# Patient Record
Sex: Female | Born: 1970 | Race: Black or African American | Hispanic: No | Marital: Single | Smoking: Former smoker
Health system: Southern US, Community
[De-identification: ages and names within clinical notes are randomized; demographics above are authoritative.]

## PROBLEM LIST (undated history)

## (undated) DIAGNOSIS — D649 Anemia, unspecified: Secondary | ICD-10-CM

## (undated) HISTORY — DX: Anemia, unspecified: D64.9

---

## 2006-01-02 HISTORY — PX: LAPAROSCOPIC GASTRIC BANDING: SHX1100

## 2015-06-01 ENCOUNTER — Telehealth: Payer: Self-pay

## 2015-06-01 ENCOUNTER — Ambulatory Visit: Payer: Self-pay | Admitting: Medical

## 2015-06-01 NOTE — Telephone Encounter (Signed)
A, she no showed first visit so she doesn't get to come back

## 2015-06-01 NOTE — Telephone Encounter (Signed)
This patient no showed for their appointment today.Which of the following is necessary for this patient.   A) No follow-up necessary   B) Follow-up urgent. Locate Patient Immediately.   C) Follow-up necessary. Contact patient and Schedule visit in ____ Days.   D) Follow-up Advised. Contact patient and Schedule visit in ____ Days.  Pt was a new pt coming in for a acute visit but since a new pt can not be rescheduled.

## 2015-06-04 NOTE — Telephone Encounter (Signed)
System noted

## 2015-08-09 ENCOUNTER — Telehealth: Payer: Self-pay | Admitting: Hematology and Oncology

## 2015-08-09 ENCOUNTER — Encounter: Payer: Self-pay | Admitting: Hematology and Oncology

## 2015-08-09 NOTE — Telephone Encounter (Signed)
Appointment scheduled with Bertis RuddyGorsuch on 8/17 at 11:15am. Demographics verified. Letter to the referring and mailed to the patient.

## 2015-08-13 ENCOUNTER — Ambulatory Visit (HOSPITAL_BASED_OUTPATIENT_CLINIC_OR_DEPARTMENT_OTHER): Payer: 59 | Admitting: Hematology and Oncology

## 2015-08-13 ENCOUNTER — Telehealth: Payer: Self-pay | Admitting: Hematology and Oncology

## 2015-08-13 ENCOUNTER — Encounter: Payer: Self-pay | Admitting: Hematology and Oncology

## 2015-08-13 ENCOUNTER — Ambulatory Visit (HOSPITAL_BASED_OUTPATIENT_CLINIC_OR_DEPARTMENT_OTHER): Payer: 59

## 2015-08-13 DIAGNOSIS — Z9884 Bariatric surgery status: Secondary | ICD-10-CM

## 2015-08-13 DIAGNOSIS — D509 Iron deficiency anemia, unspecified: Secondary | ICD-10-CM | POA: Insufficient documentation

## 2015-08-13 LAB — CBC & DIFF AND RETIC
BASO%: 1.5 % (ref 0.0–2.0)
Basophils Absolute: 0.1 10*3/uL (ref 0.0–0.1)
EOS ABS: 0.1 10*3/uL (ref 0.0–0.5)
EOS%: 3 % (ref 0.0–7.0)
HCT: 27.5 % — ABNORMAL LOW (ref 34.8–46.6)
HEMOGLOBIN: 7.7 g/dL — AB (ref 11.6–15.9)
Immature Retic Fract: 6.7 % (ref 1.60–10.00)
LYMPH%: 28.6 % (ref 14.0–49.7)
MCH: 17.7 pg — AB (ref 25.1–34.0)
MCHC: 28.1 g/dL — AB (ref 31.5–36.0)
MCV: 63.1 fL — ABNORMAL LOW (ref 79.5–101.0)
MONO#: 0.5 10*3/uL (ref 0.1–0.9)
MONO%: 13.4 % (ref 0.0–14.0)
NEUT%: 53.5 % (ref 38.4–76.8)
NEUTROS ABS: 2.2 10*3/uL (ref 1.5–6.5)
Platelets: 241 10*3/uL (ref 145–400)
RBC: 4.36 10*6/uL (ref 3.70–5.45)
RDW: 18.3 % — AB (ref 11.2–14.5)
RETIC %: 1.04 % (ref 0.70–2.10)
Retic Ct Abs: 45.34 10*3/uL (ref 33.70–90.70)
WBC: 4.1 10*3/uL (ref 3.9–10.3)
lymph#: 1.2 10*3/uL (ref 0.9–3.3)

## 2015-08-13 LAB — TECHNOLOGIST REVIEW

## 2015-08-13 NOTE — Assessment & Plan Note (Signed)
The most likely cause of her anemia is due to chronic blood loss/malabsorption syndrome. We discussed some of the risks, benefits, and alternatives of intravenous iron infusions. The patient is symptomatic from anemia and the iron level is critically low. She tolerated oral iron supplement poorly and desires to achieved higher levels of iron faster for adequate hematopoesis. Some of the side-effects to be expected including risks of infusion reactions, phlebitis, headaches, nausea and fatigue.  The patient is willing to proceed. Patient education material was dispensed.  Goal is to keep ferritin level greater than 50 I will see her back in 1 months with repeat blood work

## 2015-08-13 NOTE — Assessment & Plan Note (Signed)
The patient is at risk of multiple mineral deficiency including vitamin B-12 and vitamin D deficiency. I will order additional workup and replace as needed

## 2015-08-13 NOTE — Progress Notes (Signed)
[facility] CONSULT NOTE  Patient Care Team: No Pcp Per Patient as PCP - General (General Practice)  CHIEF COMPLAINTS/PURPOSE OF CONSULTATION:  Severe microcytic anemia  HISTORY OF PRESENTING ILLNESS:  Caitlin Sims 45 y.o. female is here because of severe, recurrent microcytic anemia  She was found to have abnormal CBC from recent routine blood work monitoring through her gynecologist office. This patient had laparoscopic gastric banding in 2008. Since then, she have recurrent, severe anemia requiring multiple blood transfusions. Her last blood transfusion was in February 2016. Most recently, her hemoglobin was low at 7.3 with microcytosis. She denies recent chest pain on exertion, shortness of breath on minimal exertion but has pre-syncopal episodes, and palpitations. She has significantly crams She had not noticed any recent bleeding such as epistaxis, hematuria or hematochezia The patient denies over the counter NSAID ingestion. She is not on antiplatelets agents.  She had no prior history or diagnosis of cancer. Her age appropriate screening programs are up-to-date. She has recent pica craving meat but eats a variety of diet. She never donated blood  The patient was prescribed oral iron supplements but it is ineffective and causes a lot of dyspepsia.  MEDICAL HISTORY:  Past Medical History:  Diagnosis Date  . Anemia     SURGICAL HISTORY: Past Surgical History:  Procedure Laterality Date  . CESAREAN SECTION    . LAPAROSCOPIC GASTRIC BANDING  2008    SOCIAL HISTORY: Social History   Social History  . Marital status: Legally Separated    Spouse name: N/A  . Number of children: 2  . Years of education: N/A   Occupational History  . service officer    Social History Main Topics  . Smoking status: Former Smoker    Packs/day: 0.25    Years: 15.00    Types: Cigarettes  . Smokeless tobacco: Current User     Comment: Vapour  . Alcohol use 0.6  oz/week    1 Glasses of wine per week  . Drug use: No  . Sexual activity: Not on file   Other Topics Concern  . Not on file   Social History Narrative  . No narrative on file    FAMILY HISTORY: Family History  Problem Relation Age of Onset  . Cancer Maternal Grandmother     stomach ca    ALLERGIES:  has No Known Allergies.  MEDICATIONS:  No current outpatient prescriptions on file.   No current facility-administered medications for this visit.     REVIEW OF SYSTEMS:   Constitutional: Denies fevers, chills or abnormal night sweats Eyes: Denies blurriness of vision, double vision or watery eyes Ears, nose, mouth, throat, and face: Denies mucositis or sore throat Respiratory: Denies cough, dyspnea or wheezes Cardiovascular: Denies chest discomfort or lower extremity swelling Gastrointestinal:  Denies nausea, heartburn or change in bowel habits Skin: Denies abnormal skin rashes Lymphatics: Denies new lymphadenopathy or easy bruising Neurological:Denies numbness, tingling or new weaknesses Behavioral/Psych: Mood is stable, no new changes  All other systems were reviewed with the patient and are negative.  PHYSICAL EXAMINATION: ECOG PERFORMANCE STATUS: 1 - Symptomatic but completely ambulatory  Vitals:   08/13/15 1412  BP: (!) 123/37  Pulse: 74  Resp: 18  Temp: 98.5 F (36.9 C)   Filed Weights   08/13/15 1412  Weight: 169 lb 11.2 oz (77 kg)    GENERAL:alert, no distress and comfortable SKIN: skin color, texture, turgor are normal, no rashes or significant lesions EYES: normal, conjunctiva are pale  and non-injected, sclera clear OROPHARYNX:no exudate, no erythema and lips, buccal mucosa, and tongue normal  NECK: supple, thyroid normal size, non-tender, without nodularity LYMPH:  no palpable lymphadenopathy in the cervical, axillary or inguinal LUNGS: clear to auscultation and percussion with normal breathing effort HEART: regular rate & rhythm and no murmurs  and no lower extremity edema ABDOMEN:abdomen soft, non-tender and normal bowel sounds. Palpable lump in the epigastric region consistent with her gastric LAP-BAND Musculoskeletal:no cyanosis of digits and no clubbing  PSYCH: alert & oriented x 3 with fluent speech NEURO: no focal motor/sensory deficits  ASSESSMENT & PLAN:  Iron deficiency anemia The most likely cause of her anemia is due to chronic blood loss/malabsorption syndrome. We discussed some of the risks, benefits, and alternatives of intravenous iron infusions. The patient is symptomatic from anemia and the iron level is critically low. She tolerated oral iron supplement poorly and desires to achieved higher levels of iron faster for adequate hematopoesis. Some of the side-effects to be expected including risks of infusion reactions, phlebitis, headaches, nausea and fatigue.  The patient is willing to proceed. Patient education material was dispensed.  Goal is to keep ferritin level greater than 50 I will see her back in 1 months with repeat blood work   S/P bariatric surgery The patient is at risk of multiple mineral deficiency including vitamin B-12 and vitamin D deficiency. I will order additional workup and replace as needed  Orders Placed This Encounter  Procedures  . Ferritin    Standing Status:   Standing    Number of Occurrences:   9    Standing Expiration Date:   08/12/2016  . CBC & Diff and Retic    Standing Status:   Standing    Number of Occurrences:   9    Standing Expiration Date:   08/12/2016  . Vitamin B12    Standing Status:   Future    Number of Occurrences:   1    Standing Expiration Date:   09/16/2016  . Iron and TIBC    Standing Status:   Future    Number of Occurrences:   1    Standing Expiration Date:   09/16/2016  . Vitamin D 25 hydroxy    Standing Status:   Future    Number of Occurrences:   1    Standing Expiration Date:   08/12/2016     All questions were answered. The patient knows to call  the clinic with any problems, questions or concerns. I spent 40 minutes counseling the patient face to face. The total time spent in the appointment was 55 minutes and more than 50% was on counseling.     [facility], Vaniah Chambers, MD 08/13/15 4:08 PM

## 2015-08-13 NOTE — Telephone Encounter (Signed)
Gave patient avs report and appointments for August and September  °

## 2015-08-14 LAB — VITAMIN D 25 HYDROXY (VIT D DEFICIENCY, FRACTURES): VIT D 25 HYDROXY: 37 ng/mL (ref 30.0–100.0)

## 2015-08-14 LAB — VITAMIN B12: VITAMIN B 12: 739 pg/mL (ref 211–946)

## 2015-08-16 ENCOUNTER — Telehealth: Payer: Self-pay | Admitting: *Deleted

## 2015-08-16 LAB — IRON AND TIBC: TIBC: 365 ug/dL (ref 236–444)

## 2015-08-16 LAB — FERRITIN: Ferritin: 5 ng/ml — ABNORMAL LOW (ref 9–269)

## 2015-08-16 NOTE — Telephone Encounter (Signed)
LM to call Dr Gorsuch' nurse 

## 2015-08-16 NOTE — Telephone Encounter (Signed)
Pt notified of message below. Verbalized understanding 

## 2015-08-16 NOTE — Telephone Encounter (Signed)
-----   Message from Artis DelayNi Gorsuch, MD sent at 08/16/2015 10:30 AM EDT ----- Regarding: labs Pls remind her of IV iron appt Labs came back for Vitamin D and B12; both levels are OK for now so I will just be giving IV iron ----- Message ----- From: Interface, Lab In Three Zero One Sent: 08/13/2015   3:33 PM To: Artis DelayNi Gorsuch, MD

## 2015-08-19 ENCOUNTER — Ambulatory Visit: Payer: Self-pay | Admitting: Hematology and Oncology

## 2015-08-20 ENCOUNTER — Ambulatory Visit (HOSPITAL_BASED_OUTPATIENT_CLINIC_OR_DEPARTMENT_OTHER): Payer: 59

## 2015-08-20 ENCOUNTER — Ambulatory Visit: Payer: 59 | Admitting: Hematology and Oncology

## 2015-08-20 VITALS — BP 100/45 | HR 75 | Temp 98.1°F | Resp 18

## 2015-08-20 DIAGNOSIS — Z9884 Bariatric surgery status: Secondary | ICD-10-CM

## 2015-08-20 DIAGNOSIS — D509 Iron deficiency anemia, unspecified: Secondary | ICD-10-CM | POA: Diagnosis not present

## 2015-08-20 MED ORDER — SODIUM CHLORIDE 0.9 % IV SOLN
510.0000 mg | Freq: Once | INTRAVENOUS | Status: AC
  Administered 2015-08-20: 510 mg via INTRAVENOUS
  Filled 2015-08-20: qty 17

## 2015-08-20 MED ORDER — SODIUM CHLORIDE 0.9 % IV SOLN
Freq: Once | INTRAVENOUS | Status: AC
  Administered 2015-08-20: 10:00:00 via INTRAVENOUS

## 2015-08-20 NOTE — Progress Notes (Signed)
First-time feraheme. Pt aware of appt new week to infuse again. 8/11 ferritin 5 and iron <11. Pt educated on potential tx symptoms. Pt verbalized understanding about calling with any concerns or visiting ED with serious symptoms. VS stable upon discharge, pt asymptomatic.

## 2015-08-20 NOTE — Patient Instructions (Signed)

## 2015-08-27 ENCOUNTER — Ambulatory Visit (HOSPITAL_BASED_OUTPATIENT_CLINIC_OR_DEPARTMENT_OTHER): Payer: 59

## 2015-08-27 VITALS — BP 123/69 | HR 66 | Temp 98.7°F | Resp 16

## 2015-08-27 DIAGNOSIS — D509 Iron deficiency anemia, unspecified: Secondary | ICD-10-CM | POA: Diagnosis not present

## 2015-08-27 DIAGNOSIS — Z9884 Bariatric surgery status: Secondary | ICD-10-CM

## 2015-08-27 MED ORDER — SODIUM CHLORIDE 0.9 % IV SOLN
Freq: Once | INTRAVENOUS | Status: AC
  Administered 2015-08-27: 11:00:00 via INTRAVENOUS

## 2015-08-27 MED ORDER — SODIUM CHLORIDE 0.9 % IV SOLN
510.0000 mg | Freq: Once | INTRAVENOUS | Status: AC
  Administered 2015-08-27: 510 mg via INTRAVENOUS
  Filled 2015-08-27: qty 17

## 2015-08-27 NOTE — Patient Instructions (Signed)

## 2015-09-30 ENCOUNTER — Ambulatory Visit (HOSPITAL_BASED_OUTPATIENT_CLINIC_OR_DEPARTMENT_OTHER): Payer: 59

## 2015-09-30 ENCOUNTER — Telehealth: Payer: Self-pay | Admitting: Hematology and Oncology

## 2015-09-30 ENCOUNTER — Other Ambulatory Visit (HOSPITAL_BASED_OUTPATIENT_CLINIC_OR_DEPARTMENT_OTHER): Payer: 59

## 2015-09-30 ENCOUNTER — Ambulatory Visit (HOSPITAL_BASED_OUTPATIENT_CLINIC_OR_DEPARTMENT_OTHER): Payer: 59 | Admitting: Hematology and Oncology

## 2015-09-30 ENCOUNTER — Encounter: Payer: Self-pay | Admitting: Hematology and Oncology

## 2015-09-30 VITALS — BP 118/67 | HR 59 | Temp 98.3°F | Resp 18 | Ht 65.5 in | Wt 172.5 lb

## 2015-09-30 VITALS — BP 108/71 | HR 68 | Temp 97.9°F | Resp 18

## 2015-09-30 DIAGNOSIS — Z9884 Bariatric surgery status: Secondary | ICD-10-CM

## 2015-09-30 DIAGNOSIS — D509 Iron deficiency anemia, unspecified: Secondary | ICD-10-CM | POA: Diagnosis not present

## 2015-09-30 DIAGNOSIS — D72819 Decreased white blood cell count, unspecified: Secondary | ICD-10-CM | POA: Diagnosis not present

## 2015-09-30 LAB — CBC & DIFF AND RETIC
BASO%: 0.5 % (ref 0.0–2.0)
BASOS ABS: 0 10*3/uL (ref 0.0–0.1)
EOS%: 7.2 % — ABNORMAL HIGH (ref 0.0–7.0)
Eosinophils Absolute: 0.3 10*3/uL (ref 0.0–0.5)
HEMATOCRIT: 36.9 % (ref 34.8–46.6)
HEMOGLOBIN: 11.3 g/dL — AB (ref 11.6–15.9)
Immature Retic Fract: 1.8 % (ref 1.60–10.00)
LYMPH#: 1.2 10*3/uL (ref 0.9–3.3)
LYMPH%: 31 % (ref 14.0–49.7)
MCH: 24.4 pg — AB (ref 25.1–34.0)
MCHC: 30.6 g/dL — AB (ref 31.5–36.0)
MCV: 79.5 fL (ref 79.5–101.0)
MONO#: 0.4 10*3/uL (ref 0.1–0.9)
MONO%: 9.5 % (ref 0.0–14.0)
NEUT%: 51.8 % (ref 38.4–76.8)
NEUTROS ABS: 2 10*3/uL (ref 1.5–6.5)
Platelets: 185 10*3/uL (ref 145–400)
RBC: 4.64 10*6/uL (ref 3.70–5.45)
RETIC %: 0.42 % — AB (ref 0.70–2.10)
RETIC CT ABS: 19.49 10*3/uL — AB (ref 33.70–90.70)
WBC: 3.8 10*3/uL — AB (ref 3.9–10.3)
nRBC: 0 % (ref 0–0)

## 2015-09-30 LAB — FERRITIN: Ferritin: 31 ng/ml (ref 9–269)

## 2015-09-30 MED ORDER — SODIUM CHLORIDE 0.9 % IV SOLN
510.0000 mg | Freq: Once | INTRAVENOUS | Status: AC
  Administered 2015-09-30: 510 mg via INTRAVENOUS
  Filled 2015-09-30: qty 17

## 2015-09-30 MED ORDER — SODIUM CHLORIDE 0.9 % IV SOLN
Freq: Once | INTRAVENOUS | Status: AC
  Administered 2015-09-30: 12:00:00 via INTRAVENOUS

## 2015-09-30 NOTE — Assessment & Plan Note (Signed)
Cause is unknown. She is not symptomatic. Recommend observation only.

## 2015-09-30 NOTE — Telephone Encounter (Signed)
APPOINTMENTS COMPLETE PER 9/28 LOS - LAB ONY 01/06/16. PATIENT TO GET PRINT OUT IN INFUSION AREA.

## 2015-09-30 NOTE — Assessment & Plan Note (Signed)
The most likely cause of her anemia is due to chronic blood loss/malabsorption syndrome. We discussed some of the risks, benefits, and alternatives of intravenous iron infusions. The patient is symptomatic from anemia and the iron level is critically low. She tolerated oral iron supplement poorly and desires to achieved higher levels of iron faster for adequate hematopoesis. Some of the side-effects to be expected including risks of infusion reactions, phlebitis, headaches, nausea and fatigue.  The patient is willing to proceed. Patient education material was dispensed.  Goal is to keep ferritin level greater than 50 She does not have a primary care doctor. I will bring her back in 4 months with repeat blood work. I will proceed with intravenous iron treatment today

## 2015-09-30 NOTE — Patient Instructions (Signed)

## 2015-09-30 NOTE — Progress Notes (Signed)
[facility] OFFICE PROGRESS NOTE  No PCP Per Patient SUMMARY OF HEMATOLOGIC HISTORY:  Caitlin Sims 45 y.o. female is here because of severe, recurrent microcytic anemia  She was found to have abnormal CBC from recent routine blood work monitoring through her gynecologist office. This patient had laparoscopic gastric banding in 2008. Since then, she have recurrent, severe anemia requiring multiple blood transfusions. Her last blood transfusion was in February 2016. Most recently, her hemoglobin was low at 7.3 with microcytosis. She denies recent chest pain on exertion, shortness of breath on minimal exertion but has pre-syncopal episodes, and palpitations. She has significantly crams She had not noticed any recent bleeding such as epistaxis, hematuria or hematochezia The patient denies over the counter NSAID ingestion. She is not on antiplatelets agents.  She had no prior history or diagnosis of cancer. Her age appropriate screening programs are up-to-date. She has recent pica craving meat but eats a variety of diet. She never donated blood  The patient was prescribed oral iron supplements but it is ineffective and causes a lot of dyspepsia. She was prescribed intravenous iron in August 2017 with great improvement of anemia INTERVAL HISTORY: Caitlin Sims 45 y.o. female returns for further follow-up. She feels well. She denies infusion reaction. She denies recent pica. The patient denies any recent signs or symptoms of bleeding such as spontaneous epistaxis, hematuria or hematochezia.   I have reviewed the past medical history, past surgical history, social history and family history with the patient and they are unchanged from previous note.  ALLERGIES:  has No Known Allergies.  MEDICATIONS:  No current outpatient prescriptions on file.   No current facility-administered medications for this visit.      REVIEW OF SYSTEMS:   Constitutional: Denies fevers,  chills or night sweats Eyes: Denies blurriness of vision Ears, nose, mouth, throat, and face: Denies mucositis or sore throat Respiratory: Denies cough, dyspnea or wheezes Cardiovascular: Denies palpitation, chest discomfort or lower extremity swelling Gastrointestinal:  Denies nausea, heartburn or change in bowel habits Skin: Denies abnormal skin rashes Lymphatics: Denies new lymphadenopathy or easy bruising Neurological:Denies numbness, tingling or new weaknesses Behavioral/Psych: Mood is stable, no new changes  All other systems were reviewed with the patient and are negative.  PHYSICAL EXAMINATION: ECOG PERFORMANCE STATUS: 0 - Asymptomatic  Vitals:   09/30/15 1104  BP: 118/67  Pulse: (!) 59  Resp: 18  Temp: 98.3 F (36.8 C)   Filed Weights   09/30/15 1104  Weight: 172 lb 8 oz (78.2 kg)    GENERAL:alert, no distress and comfortable SKIN: skin color, texture, turgor are normal, no rashes or significant lesions EYES: normal, Conjunctiva are pink and non-injected, sclera clear OROPHARYNX:no exudate, no erythema and lips, buccal mucosa, and tongue normal  NECK: supple, thyroid normal size, non-tender, without nodularity LYMPH:  no palpable lymphadenopathy in the cervical, axillary or inguinal LUNGS: clear to auscultation and percussion with normal breathing effort HEART: regular rate & rhythm and no murmurs and no lower extremity edema ABDOMEN:abdomen soft, non-tender and normal bowel sounds Musculoskeletal:no cyanosis of digits and no clubbing  NEURO: alert & oriented x 3 with fluent speech, no focal motor/sensory deficits  LABORATORY DATA:  I have reviewed the data as listed  No results found for: NA, K, CL, CO2, GLUCOSE, BUN, CREATININE, CALCIUM, PROT, ALBUMIN, AST, ALT, ALKPHOS, BILITOT, GFRNONAA, GFRAA  No results found for: SPEP, UPEP  Lab Results  Component Value Date   WBC 3.8 (L) 09/30/2015   NEUTROABS 2.0  09/30/2015   HGB 11.3 (L) 09/30/2015   HCT 36.9  09/30/2015   MCV 79.5 09/30/2015   PLT 185 09/30/2015      Chemistry   No results found for: NA, K, CL, CO2, BUN, CREATININE, GLU No results found for: CALCIUM, ALKPHOS, AST, ALT, BILITOT     ASSESSMENT & PLAN:  Iron deficiency anemia The most likely cause of her anemia is due to chronic blood loss/malabsorption syndrome. We discussed some of the risks, benefits, and alternatives of intravenous iron infusions. The patient is symptomatic from anemia and the iron level is critically low. She tolerated oral iron supplement poorly and desires to achieved higher levels of iron faster for adequate hematopoesis. Some of the side-effects to be expected including risks of infusion reactions, phlebitis, headaches, nausea and fatigue.  The patient is willing to proceed. Patient education material was dispensed.  Goal is to keep ferritin level greater than 50 She does not have a primary care doctor. I will bring her back in 4 months with repeat blood work. I will proceed with intravenous iron treatment today   S/P bariatric surgery The patient is at risk of multiple mineral deficiency including vitamin B-12 and vitamin D deficiency. Recent workup showed adequate replacement.  Leukopenia Cause is unknown. She is not symptomatic. Recommend observation only.   All questions were answered. The patient knows to call the clinic with any problems, questions or concerns. No barriers to learning was detected.  I spent 15 minutes counseling the patient face to face. The total time spent in the appointment was 20 minutes and more than 50% was on counseling.     Artis DelayNi Brewster Wolters, MD 9/28/20173:56 PM

## 2015-09-30 NOTE — Assessment & Plan Note (Signed)
The patient is at risk of multiple mineral deficiency including vitamin B-12 and vitamin D deficiency. Recent workup showed adequate replacement.

## 2016-01-06 ENCOUNTER — Telehealth: Payer: Self-pay | Admitting: *Deleted

## 2016-01-06 ENCOUNTER — Other Ambulatory Visit (HOSPITAL_BASED_OUTPATIENT_CLINIC_OR_DEPARTMENT_OTHER): Payer: 59

## 2016-01-06 ENCOUNTER — Other Ambulatory Visit: Payer: Self-pay | Admitting: Hematology and Oncology

## 2016-01-06 DIAGNOSIS — D509 Iron deficiency anemia, unspecified: Secondary | ICD-10-CM | POA: Diagnosis not present

## 2016-01-06 DIAGNOSIS — Z9884 Bariatric surgery status: Secondary | ICD-10-CM

## 2016-01-06 LAB — CBC & DIFF AND RETIC
BASO%: 0.9 % (ref 0.0–2.0)
BASOS ABS: 0 10*3/uL (ref 0.0–0.1)
EOS%: 7.3 % — AB (ref 0.0–7.0)
Eosinophils Absolute: 0.2 10*3/uL (ref 0.0–0.5)
HEMATOCRIT: 37.8 % (ref 34.8–46.6)
HGB: 11.9 g/dL (ref 11.6–15.9)
Immature Retic Fract: 3.4 % (ref 1.60–10.00)
LYMPH%: 36.1 % (ref 14.0–49.7)
MCH: 28.3 pg (ref 25.1–34.0)
MCHC: 31.5 g/dL (ref 31.5–36.0)
MCV: 89.8 fL (ref 79.5–101.0)
MONO#: 0.4 10*3/uL (ref 0.1–0.9)
MONO%: 11.3 % (ref 0.0–14.0)
NEUT#: 1.5 10*3/uL (ref 1.5–6.5)
NEUT%: 44.4 % (ref 38.4–76.8)
PLATELETS: 166 10*3/uL (ref 145–400)
RBC: 4.21 10*6/uL (ref 3.70–5.45)
RDW: 13.9 % (ref 11.2–14.5)
Retic %: 1.66 % (ref 0.70–2.10)
Retic Ct Abs: 69.89 10*3/uL (ref 33.70–90.70)
WBC: 3.3 10*3/uL — ABNORMAL LOW (ref 3.9–10.3)
lymph#: 1.2 10*3/uL (ref 0.9–3.3)

## 2016-01-06 LAB — FERRITIN: Ferritin: 14 ng/ml (ref 9–269)

## 2016-01-06 NOTE — Telephone Encounter (Signed)
Pt notified of message below.  States any day works for her.  Msg to scheduler for labs in April and see Dr Bertis RuddyGorsuch the following week with possible iron infusion

## 2016-01-06 NOTE — Telephone Encounter (Signed)
-----   Message from Artis DelayNi Gorsuch, MD sent at 01/06/2016 10:31 AM EST ----- Regarding: labs PLs let her know CBC OK, not anemic Iron level a bit low I suspect she may need IV iron again in 3 months I recommend she returns for labs in April. Please ask what day works and place scheduling msg for labs 1 week before she sees me with same day IV feraheme scheduled ----- Message ----- From: Interface, Lab In Three Zero One Sent: 01/06/2016   8:31 AM To: Artis DelayNi Gorsuch, MD

## 2016-04-06 ENCOUNTER — Other Ambulatory Visit: Payer: 59

## 2016-04-13 ENCOUNTER — Ambulatory Visit: Payer: 59 | Admitting: Hematology and Oncology

## 2016-04-13 ENCOUNTER — Ambulatory Visit: Payer: 59

## 2016-05-10 ENCOUNTER — Telehealth: Payer: Self-pay | Admitting: Hematology and Oncology

## 2016-05-10 NOTE — Telephone Encounter (Signed)
R/s per patient. Patient came in to reschedule a missed appt from 4/5 and 4/12. Labs and f/u scheduled - messaged Dr. Bertis RuddyGorsuch to see if infusion still needed.

## 2016-05-30 ENCOUNTER — Other Ambulatory Visit (HOSPITAL_BASED_OUTPATIENT_CLINIC_OR_DEPARTMENT_OTHER): Payer: 59

## 2016-05-30 ENCOUNTER — Ambulatory Visit (HOSPITAL_BASED_OUTPATIENT_CLINIC_OR_DEPARTMENT_OTHER): Payer: 59 | Admitting: Hematology and Oncology

## 2016-05-30 ENCOUNTER — Encounter: Payer: Self-pay | Admitting: Hematology and Oncology

## 2016-05-30 ENCOUNTER — Telehealth: Payer: Self-pay | Admitting: Hematology and Oncology

## 2016-05-30 VITALS — BP 130/84 | HR 63 | Temp 98.7°F | Resp 18 | Ht 65.5 in | Wt 197.0 lb

## 2016-05-30 DIAGNOSIS — D72819 Decreased white blood cell count, unspecified: Secondary | ICD-10-CM

## 2016-05-30 DIAGNOSIS — Z9884 Bariatric surgery status: Secondary | ICD-10-CM

## 2016-05-30 DIAGNOSIS — D509 Iron deficiency anemia, unspecified: Secondary | ICD-10-CM

## 2016-05-30 DIAGNOSIS — R5383 Other fatigue: Secondary | ICD-10-CM | POA: Insufficient documentation

## 2016-05-30 LAB — CBC & DIFF AND RETIC
BASO%: 0.6 % (ref 0.0–2.0)
Basophils Absolute: 0 10*3/uL (ref 0.0–0.1)
EOS%: 4.8 % (ref 0.0–7.0)
Eosinophils Absolute: 0.2 10*3/uL (ref 0.0–0.5)
HCT: 36.8 % (ref 34.8–46.6)
HGB: 11.5 g/dL — ABNORMAL LOW (ref 11.6–15.9)
Immature Retic Fract: 7.4 % (ref 1.60–10.00)
LYMPH%: 38.6 % (ref 14.0–49.7)
MCH: 27.6 pg (ref 25.1–34.0)
MCHC: 31.3 g/dL — AB (ref 31.5–36.0)
MCV: 88.2 fL (ref 79.5–101.0)
MONO#: 0.5 10*3/uL (ref 0.1–0.9)
MONO%: 14.4 % — AB (ref 0.0–14.0)
NEUT%: 41.6 % (ref 38.4–76.8)
NEUTROS ABS: 1.4 10*3/uL — AB (ref 1.5–6.5)
Platelets: 192 10*3/uL (ref 145–400)
RBC: 4.17 10*6/uL (ref 3.70–5.45)
RDW: 13.7 % (ref 11.2–14.5)
RETIC %: 1.7 % (ref 0.70–2.10)
Retic Ct Abs: 70.89 10*3/uL (ref 33.70–90.70)
WBC: 3.3 10*3/uL — AB (ref 3.9–10.3)
lymph#: 1.3 10*3/uL (ref 0.9–3.3)

## 2016-05-30 LAB — FERRITIN: FERRITIN: 11 ng/mL (ref 9–269)

## 2016-05-30 NOTE — Assessment & Plan Note (Signed)
The most likely cause of her anemia is due to chronic blood loss/malabsorption syndrome. We discussed some of the risks, benefits, and alternatives of intravenous iron infusions. The patient is symptomatic from anemia and the iron level is critically low. She tolerated oral iron supplement poorly and desires to achieved higher levels of iron faster for adequate hematopoesis. Some of the side-effects to be expected including risks of infusion reactions, phlebitis, headaches, nausea and fatigue.  The patient is willing to proceed. Patient education material was dispensed.  Goal is to keep ferritin level greater than 50 I recommend IV iron weekly 2 and then see her back in 8 months

## 2016-05-30 NOTE — Assessment & Plan Note (Signed)
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 Just to be sure, I will check copper and serum vitamin B12

## 2016-05-30 NOTE — Assessment & Plan Note (Signed)
The patient is at risk of multiple mineral deficiencies I will check serum vitamin B12 and copper level next month

## 2016-05-30 NOTE — Telephone Encounter (Signed)
Scheduled appts per 5/29 LOS -

## 2016-05-30 NOTE — Progress Notes (Signed)
[facility] OFFICE PROGRESS NOTE  Patient, No Pcp Per SUMMARY OF HEMATOLOGIC HISTORY:  Caitlin Sims is here because of severe, recurrent microcytic anemia  She was found to have abnormal CBC from recent routine blood work monitoring through her gynecologist office. This patient had laparoscopic gastric banding in 2008. Since then, she have recurrent, severe anemia requiring multiple blood transfusions. Her last blood transfusion was in February 2016. Most recently, her hemoglobin was low at 7.3 with microcytosis. She denies recent chest pain on exertion, shortness of breath on minimal exertion but has pre-syncopal episodes, and palpitations. She has significantly crams She had not noticed any recent bleeding such as epistaxis, hematuria or hematochezia The patient denies over the counter NSAID ingestion. She is not on antiplatelets agents.  She had no prior history or diagnosis of cancer. Her age appropriate screening programs are up-to-date. She has recent pica craving meat but eats a variety of diet. She never donated blood  The patient was prescribed oral iron supplements but it is ineffective and causes a lot of dyspepsia. She was prescribed intravenous iron in August 2017 with great improvement of anemia INTERVAL HISTORY: Caitlin Sims 46 y.o. female returns for further follow-up. She complain of excessive fatigue She had recent irregular periods.  She has appointment pending to see her GYN doctor She denies recent infection The patient denies any recent signs or symptoms of bleeding such as spontaneous epistaxis, hematuria or hematochezia.  I have reviewed the past medical history, past surgical history, social history and family history with the patient and they are unchanged from previous note.  ALLERGIES:  has No Known Allergies.  MEDICATIONS:  No current outpatient prescriptions on file.   No current facility-administered medications for this visit.       REVIEW OF SYSTEMS:   Constitutional: Denies fevers, chills or night sweats Eyes: Denies blurriness of vision Ears, nose, mouth, throat, and face: Denies mucositis or sore throat Respiratory: Denies cough, dyspnea or wheezes Cardiovascular: Denies palpitation, chest discomfort or lower extremity swelling Gastrointestinal:  Denies nausea, heartburn or change in bowel habits Skin: Denies abnormal skin rashes Lymphatics: Denies new lymphadenopathy or easy bruising Neurological:Denies numbness, tingling or new weaknesses Behavioral/Psych: Mood is stable, no new changes  All other systems were reviewed with the patient and are negative.  PHYSICAL EXAMINATION: ECOG PERFORMANCE STATUS: 1 - Symptomatic but completely ambulatory  Vitals:   05/30/16 0902  BP: 130/84  Pulse: 63  Resp: 18  Temp: 98.7 F (37.1 C)   Filed Weights   05/30/16 0902  Weight: 197 lb (89.4 kg)    GENERAL:alert, no distress and comfortable SKIN: skin color, texture, turgor are normal, no rashes or significant lesions EYES: normal, Conjunctiva are pink and non-injected, sclera clear Musculoskeletal:no cyanosis of digits and no clubbing  NEURO: alert & oriented x 3 with fluent speech, no focal motor/sensory deficits  LABORATORY DATA:  I have reviewed the data as listed  No results found for: NA, K, CL, CO2, GLUCOSE, BUN, CREATININE, CALCIUM, PROT, ALBUMIN, AST, ALT, ALKPHOS, BILITOT, GFRNONAA, GFRAA  No results found for: SPEP, UPEP  Lab Results  Component Value Date   WBC 3.3 (L) 05/30/2016   NEUTROABS 1.4 (L) 05/30/2016   HGB 11.5 (L) 05/30/2016   HCT 36.8 05/30/2016   MCV 88.2 05/30/2016   PLT 192 05/30/2016      Chemistry   No results found for: NA, K, CL, CO2, BUN, CREATININE, GLU No results found for: CALCIUM, ALKPHOS, AST, ALT, BILITOT  ASSESSMENT & PLAN:  Iron deficiency anemia The most likely cause of her anemia is due to chronic blood loss/malabsorption syndrome. We discussed  some of the risks, benefits, and alternatives of intravenous iron infusions. The patient is symptomatic from anemia and the iron level is critically low. She tolerated oral iron supplement poorly and desires to achieved higher levels of iron faster for adequate hematopoesis. Some of the side-effects to be expected including risks of infusion reactions, phlebitis, headaches, nausea and fatigue.  The patient is willing to proceed. Patient education material was dispensed.  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 Just to be sure, I will check copper and serum vitamin B12  S/P bariatric surgery The patient is at risk of multiple mineral deficiencies I will check serum vitamin B12 and copper level next month  Other fatigue She has excessive fatigue and recent weight gain It is likely that her bariatric surgery is not successful I will check serum TSH just to be sure that she does not have hypothyroidism   Orders Placed This Encounter  Procedures  . Vitamin B12    Standing Status:   Future    Standing Expiration Date:   07/04/2017  . Copper, serum    Standing Status:   Future    Standing Expiration Date:   05/30/2017  . Vitamin D 25 hydroxy    Standing Status:   Future    Standing Expiration Date:   05/30/2017  . TSH    Standing Status:   Future    Standing Expiration Date:   07/04/2017  . CBC with Differential/Platelet    Standing Status:   Future    Standing Expiration Date:   07/04/2017    All questions were answered. The patient knows to call the clinic with any problems, questions or concerns. No barriers to learning was detected.  I spent 15 minutes counseling the patient face to face. The total time spent in the appointment was 20 minutes and more than 50% was on counseling.     Artis Delay, MD 5/29/201812:00  PM

## 2016-05-30 NOTE — Assessment & Plan Note (Signed)
She has excessive fatigue and recent weight gain It is likely that her bariatric surgery is not successful I will check serum TSH just to be sure that she does not have hypothyroidism

## 2016-06-06 ENCOUNTER — Ambulatory Visit (HOSPITAL_BASED_OUTPATIENT_CLINIC_OR_DEPARTMENT_OTHER): Payer: 59

## 2016-06-06 VITALS — BP 131/69 | HR 70 | Temp 97.9°F | Resp 18

## 2016-06-06 DIAGNOSIS — D509 Iron deficiency anemia, unspecified: Secondary | ICD-10-CM

## 2016-06-06 DIAGNOSIS — Z9884 Bariatric surgery status: Secondary | ICD-10-CM

## 2016-06-06 MED ORDER — SODIUM CHLORIDE 0.9 % IV SOLN
510.0000 mg | Freq: Once | INTRAVENOUS | Status: AC
  Administered 2016-06-06: 510 mg via INTRAVENOUS
  Filled 2016-06-06: qty 17

## 2016-06-06 MED ORDER — SODIUM CHLORIDE 0.9 % IV SOLN
Freq: Once | INTRAVENOUS | Status: AC
  Administered 2016-06-06: 09:00:00 via INTRAVENOUS

## 2016-06-06 NOTE — Patient Instructions (Signed)

## 2016-06-13 ENCOUNTER — Other Ambulatory Visit: Payer: Self-pay | Admitting: Hematology and Oncology

## 2016-06-13 ENCOUNTER — Ambulatory Visit (HOSPITAL_BASED_OUTPATIENT_CLINIC_OR_DEPARTMENT_OTHER): Payer: 59

## 2016-06-13 ENCOUNTER — Other Ambulatory Visit (HOSPITAL_BASED_OUTPATIENT_CLINIC_OR_DEPARTMENT_OTHER): Payer: 59

## 2016-06-13 VITALS — BP 117/98 | HR 68 | Temp 98.5°F | Resp 18

## 2016-06-13 DIAGNOSIS — D509 Iron deficiency anemia, unspecified: Secondary | ICD-10-CM | POA: Diagnosis not present

## 2016-06-13 DIAGNOSIS — Z9884 Bariatric surgery status: Secondary | ICD-10-CM

## 2016-06-13 DIAGNOSIS — R5383 Other fatigue: Secondary | ICD-10-CM

## 2016-06-13 DIAGNOSIS — D72819 Decreased white blood cell count, unspecified: Secondary | ICD-10-CM | POA: Diagnosis not present

## 2016-06-13 LAB — TSH: TSH: 1.001 m(IU)/L (ref 0.308–3.960)

## 2016-06-13 LAB — CBC WITH DIFFERENTIAL/PLATELET
BASO%: 0.6 % (ref 0.0–2.0)
BASOS ABS: 0 10*3/uL (ref 0.0–0.1)
EOS%: 3.6 % (ref 0.0–7.0)
Eosinophils Absolute: 0.1 10*3/uL (ref 0.0–0.5)
HCT: 36.6 % (ref 34.8–46.6)
HEMOGLOBIN: 11.4 g/dL — AB (ref 11.6–15.9)
LYMPH%: 31 % (ref 14.0–49.7)
MCH: 27.4 pg (ref 25.1–34.0)
MCHC: 31.1 g/dL — AB (ref 31.5–36.0)
MCV: 88 fL (ref 79.5–101.0)
MONO#: 0.4 10*3/uL (ref 0.1–0.9)
MONO%: 10.9 % (ref 0.0–14.0)
NEUT#: 1.9 10*3/uL (ref 1.5–6.5)
NEUT%: 53.9 % (ref 38.4–76.8)
PLATELETS: 170 10*3/uL (ref 145–400)
RBC: 4.16 10*6/uL (ref 3.70–5.45)
RDW: 14.2 % (ref 11.2–14.5)
WBC: 3.6 10*3/uL — ABNORMAL LOW (ref 3.9–10.3)
lymph#: 1.1 10*3/uL (ref 0.9–3.3)

## 2016-06-13 MED ORDER — SODIUM CHLORIDE 0.9 % IV SOLN
510.0000 mg | Freq: Once | INTRAVENOUS | Status: AC
  Administered 2016-06-13: 510 mg via INTRAVENOUS
  Filled 2016-06-13: qty 17

## 2016-06-13 NOTE — Progress Notes (Signed)
Pt inquired about B12 shot, Per Dr Bertis RuddyGorsuch will call pt with results, if B12 abnormal will tx.

## 2016-06-13 NOTE — Patient Instructions (Signed)

## 2016-06-14 LAB — VITAMIN D 25 HYDROXY (VIT D DEFICIENCY, FRACTURES): Vitamin D, 25-Hydroxy: 26.1 ng/mL — ABNORMAL LOW (ref 30.0–100.0)

## 2016-06-14 LAB — VITAMIN B12: VITAMIN B 12: 587 pg/mL (ref 232–1245)

## 2016-06-15 ENCOUNTER — Telehealth: Payer: Self-pay | Admitting: *Deleted

## 2016-06-15 LAB — COPPER, SERUM: Copper: 112 ug/dL (ref 72–166)

## 2016-06-15 NOTE — Telephone Encounter (Signed)
Notified of message below. Verbalized understanding 

## 2016-06-15 NOTE — Telephone Encounter (Signed)
-----   Message from Artis DelayNi Gorsuch, MD sent at 06/15/2016  7:06 AM EDT ----- Regarding: blood test results Please let her know B12 level is OK Vitamin D level is low I recommend over the counter dose of 2000 units per day (no prescription is needed)  She needs close follow-up for repeat labs in 6 months with PCP ----- Message ----- From: Interface, Lab In Three Zero One Sent: 06/13/2016   9:06 AM To: Artis DelayNi Gorsuch, MD

## 2016-06-25 ENCOUNTER — Encounter (HOSPITAL_BASED_OUTPATIENT_CLINIC_OR_DEPARTMENT_OTHER): Payer: Self-pay | Admitting: Emergency Medicine

## 2016-06-25 ENCOUNTER — Emergency Department (HOSPITAL_BASED_OUTPATIENT_CLINIC_OR_DEPARTMENT_OTHER): Payer: Worker's Compensation

## 2016-06-25 ENCOUNTER — Emergency Department (HOSPITAL_BASED_OUTPATIENT_CLINIC_OR_DEPARTMENT_OTHER)
Admission: EM | Admit: 2016-06-25 | Discharge: 2016-06-25 | Disposition: A | Discharge status: Home / Self Care | Payer: Worker's Compensation | Attending: Emergency Medicine | Admitting: Emergency Medicine

## 2016-06-25 DIAGNOSIS — S6992XA Unspecified injury of left wrist, hand and finger(s), initial encounter: Secondary | ICD-10-CM

## 2016-06-25 DIAGNOSIS — Z87891 Personal history of nicotine dependence: Secondary | ICD-10-CM | POA: Diagnosis not present

## 2016-06-25 DIAGNOSIS — S60222A Contusion of left hand, initial encounter: Secondary | ICD-10-CM | POA: Diagnosis not present

## 2016-06-25 DIAGNOSIS — Y929 Unspecified place or not applicable: Secondary | ICD-10-CM | POA: Diagnosis not present

## 2016-06-25 DIAGNOSIS — W230XXA Caught, crushed, jammed, or pinched between moving objects, initial encounter: Secondary | ICD-10-CM | POA: Insufficient documentation

## 2016-06-25 DIAGNOSIS — Y9389 Activity, other specified: Secondary | ICD-10-CM | POA: Diagnosis not present

## 2016-06-25 DIAGNOSIS — Y999 Unspecified external cause status: Secondary | ICD-10-CM | POA: Insufficient documentation

## 2016-06-25 NOTE — ED Notes (Signed)
Pt c/o pain to the medial aspect of her left hand after getting it caught between the food cart and a door. Moves fingers. Feels touch. Cap refill < 3 sec. Ice being applied.

## 2016-06-25 NOTE — ED Notes (Signed)
Pt given d/c instructions as per chart. Verbalizes understanding. No questions. 

## 2016-06-25 NOTE — Discharge Instructions (Signed)
You have been seen today for a hand injury. There were no acute abnormalities on the x-rays, including no sign of fracture or dislocation. Pain: Take 600 mg of ibuprofen every 6 hours or 440 mg (over the counter dose) to 500 mg (prescription dose) of naproxen every 12 hours or for the next 3 days. After this time, these medications may be used as needed for pain. Take these medications with food to avoid upset stomach. Choose only one of these medications, do not take them together.  Ice: May apply ice to the area over the next 24 hours for 15 minutes at a time to reduce swelling. Elevation: Keep the extremity elevated as often as possible to reduce pain and inflammation. Support: Wear the brace for support and comfort. Wear this until pain resolves. You will be weight-bearing as tolerated, which means you can slowly start to put weight on the extremity and increase amount and frequency as pain allows. Exercises: Start by performing these exercises a few times a week, increasing the frequency until you are performing them twice daily.  Follow up: If symptoms are improving, you may follow up with your primary care provider for any continued management.

## 2016-06-25 NOTE — ED Triage Notes (Signed)
L hand pain after getting it caught between the food cart and the door at work.

## 2016-06-25 NOTE — ED Provider Notes (Signed)
[facility]   CSN: [id number] Arrival date & time: 06/25/16  1550  By signing my name below, I, Linna DarnerRussell Turner, attest that this documentation has been prepared under the direction and in the presence of Taleya Whitcher, PA-C. Electronically Signed: Linna Darnerussell Turner, Scribe. 06/25/2016. 4:24 PM.  History   Chief Complaint Chief Complaint  Patient presents with  . Hand Injury   The history is provided by the patient. No language interpreter was used.    HPI Comments: Caitlin Sims is a 46 y.o. female who presents to the Emergency Department for evaluation of a left hand injury sustained prior to arrival this afternoon. She states she was pulling a food cart through a door and her left hand became caught between the closing door and the cart. Patient reports 7/10 throbbing pain to the palmar aspect of the fifth metacarpal on the left as well as some associated swelling and bruising. No other injuries sustained. No medications or treatments tried PTA. Patient denies numbness/tingling, open wounds, weakness, or any other associated symptoms. No PCP.   Past Medical History:  Diagnosis Date  . Anemia     Patient Active Problem List   Diagnosis Date Noted  . Other fatigue 05/30/2016  . Leukopenia 09/30/2015  . Iron deficiency anemia 08/13/2015  . S/P bariatric surgery 08/13/2015    Past Surgical History:  Procedure Laterality Date  . CESAREAN SECTION    . LAPAROSCOPIC GASTRIC BANDING  2008    OB History    No data available       Home Medications    Prior to Admission medications   Not on File    Family History Family History  Problem Relation Age of Onset  . Cancer Maternal Grandmother        stomach ca    Social History Social History  Substance Use Topics  . Smoking status: Former Smoker    Packs/day: 0.25    Years: 15.00    Types: Cigarettes  . Smokeless tobacco: Current User     Comment: Vapour  . Alcohol use 0.6 oz/week    1  Glasses of wine per week     Allergies   Patient has no known allergies.   Review of Systems Review of Systems  Musculoskeletal: Positive for arthralgias and joint swelling.  Skin: Positive for color change. Negative for wound.  Neurological: Negative for weakness and numbness.   Physical Exam Updated Vital Signs BP 126/72 (BP Location: Right Arm)   Pulse 73   Temp 98.3 F (36.8 C) (Oral)   Resp 16   Ht 5\' 5"  (1.651 m)   Wt 200 lb (90.7 kg)   LMP 06/24/2016   SpO2 100%   BMI 33.28 kg/m   Physical Exam  Constitutional: She appears well-developed and well-nourished. No distress.  HENT:  Head: Normocephalic and atraumatic.  Eyes: Conjunctivae are normal.  Neck: Neck supple.  Cardiovascular: Normal rate, regular rhythm and intact distal pulses.   Pulmonary/Chest: Effort normal.  Musculoskeletal: She exhibits tenderness.  Tenderness and some bruising along the palmar side of the 5th metacarpal on the left hand. FROM in the fingers of the left hand. Can touch thumb to each of the other fingers on the left hand. Abduction and adduction against resistance is intact. Full range of motion in the left wrist.  Neurological: She is alert. No cranial nerve deficit or sensory deficit.  No noted sensory deficits. Strength 5 out of 5.  Skin: Skin is warm and dry.  Capillary refill takes less than 2 seconds. She is not diaphoretic.  Psychiatric: She has a normal mood and affect. Her behavior is normal.  Nursing note and vitals reviewed.  ED Treatments / Results  Labs (all labs ordered are listed, but only abnormal results are displayed) Labs Reviewed - No data to display  EKG  EKG Interpretation None       Radiology   Dg Hand Complete Left  Result Date: 06/25/2016 CLINICAL DATA:  Crush injury of the left hand. EXAM: LEFT HAND - COMPLETE 3+ VIEW COMPARISON:  None. FINDINGS: There is no evidence of fracture or dislocation. There is no evidence of arthropathy or other focal  bone abnormality. Soft tissues are unremarkable. IMPRESSION: Negative. Electronically Signed   By: Francene Boyers M.D.   On: 06/25/2016 16:15   Procedures Procedures (including critical care time)  DIAGNOSTIC STUDIES: Oxygen Saturation is 100% on RA, normal by my interpretation.    COORDINATION OF CARE: 4:23 PM Discussed treatment plan with pt at bedside and pt agreed to plan.  Medications Ordered in ED Medications - No data to display   Initial Impression / Assessment and Plan / ED Course  I have reviewed the triage vital signs and the nursing notes.  Pertinent labs & imaging results that were available during my care of the patient were reviewed by me and considered in my medical decision making (see chart for details).     Patient presents with a hand injury. No acute abnormality on x-ray. PCP follow-up. Resources given. The patient was given instructions for home care as well as return precautions. Patient voices understanding of these instructions, accepts the plan, and is comfortable with discharge.  Final Clinical Impressions(s) / ED Diagnoses   Final diagnoses:  Injury of left hand, initial encounter    New Prescriptions There are no discharge medications for this patient.  I personally performed the services described in this documentation, which was scribed in my presence. The recorded information has been reviewed and is accurate.   Anselm Pancoast, PA-C 06/27/16 1832    Loren Racer, MD 06/30/16 [phone]

## 2016-11-12 ENCOUNTER — Emergency Department (HOSPITAL_BASED_OUTPATIENT_CLINIC_OR_DEPARTMENT_OTHER): Payer: 59

## 2016-11-12 ENCOUNTER — Encounter (HOSPITAL_BASED_OUTPATIENT_CLINIC_OR_DEPARTMENT_OTHER): Payer: Self-pay | Admitting: Emergency Medicine

## 2016-11-12 ENCOUNTER — Emergency Department (HOSPITAL_BASED_OUTPATIENT_CLINIC_OR_DEPARTMENT_OTHER)
Admission: EM | Admit: 2016-11-12 | Discharge: 2016-11-12 | Disposition: A | Discharge status: Home / Self Care | Payer: 59 | Attending: Emergency Medicine | Admitting: Emergency Medicine

## 2016-11-12 ENCOUNTER — Other Ambulatory Visit: Payer: Self-pay

## 2016-11-12 DIAGNOSIS — M25561 Pain in right knee: Secondary | ICD-10-CM | POA: Diagnosis present

## 2016-11-12 DIAGNOSIS — F17220 Nicotine dependence, chewing tobacco, uncomplicated: Secondary | ICD-10-CM | POA: Insufficient documentation

## 2016-11-12 LAB — URINALYSIS, ROUTINE W REFLEX MICROSCOPIC
Bilirubin Urine: NEGATIVE
Glucose, UA: NEGATIVE mg/dL
Ketones, ur: NEGATIVE mg/dL
Leukocytes, UA: NEGATIVE
Nitrite: POSITIVE — AB
Protein, ur: NEGATIVE mg/dL
Specific Gravity, Urine: 1.02 (ref 1.005–1.030)
pH: 7 (ref 5.0–8.0)

## 2016-11-12 LAB — URINALYSIS, MICROSCOPIC (REFLEX)
RBC / HPF: NONE SEEN RBC/hpf (ref 0–5)
Squamous Epithelial / LPF: NONE SEEN

## 2016-11-12 LAB — PREGNANCY, URINE: Preg Test, Ur: NEGATIVE

## 2016-11-12 MED ORDER — NAPROXEN 500 MG PO TABS: 500.0000 mg | ORAL_TABLET | Freq: Two times a day (BID) | ORAL | 0 refills | Status: DC

## 2016-11-12 NOTE — ED Notes (Signed)
Patient transported to X-ray 
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
## 2016-11-12 NOTE — ED Notes (Signed)
ED Provider at bedside. 
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
## 2017-02-05 ENCOUNTER — Other Ambulatory Visit: Payer: Self-pay | Admitting: Hematology and Oncology

## 2017-02-05 DIAGNOSIS — E559 Vitamin D deficiency, unspecified: Secondary | ICD-10-CM

## 2017-02-05 DIAGNOSIS — D509 Iron deficiency anemia, unspecified: Secondary | ICD-10-CM

## 2017-02-06 ENCOUNTER — Other Ambulatory Visit: Payer: 59

## 2017-02-13 ENCOUNTER — Ambulatory Visit: Payer: 59 | Admitting: Hematology and Oncology

## 2017-02-13 ENCOUNTER — Encounter: Payer: Self-pay | Admitting: Hematology and Oncology

## 2017-08-17 ENCOUNTER — Emergency Department (HOSPITAL_BASED_OUTPATIENT_CLINIC_OR_DEPARTMENT_OTHER): Payer: 59

## 2017-08-17 ENCOUNTER — Other Ambulatory Visit: Payer: Self-pay

## 2017-08-17 ENCOUNTER — Encounter (HOSPITAL_BASED_OUTPATIENT_CLINIC_OR_DEPARTMENT_OTHER): Payer: Self-pay | Admitting: Emergency Medicine

## 2017-08-17 ENCOUNTER — Emergency Department (HOSPITAL_BASED_OUTPATIENT_CLINIC_OR_DEPARTMENT_OTHER)
Admission: EM | Admit: 2017-08-17 | Discharge: 2017-08-17 | Disposition: A | Discharge status: Home / Self Care | Payer: 59 | Attending: Emergency Medicine | Admitting: Emergency Medicine

## 2017-08-17 DIAGNOSIS — Z79899 Other long term (current) drug therapy: Secondary | ICD-10-CM | POA: Diagnosis not present

## 2017-08-17 DIAGNOSIS — Z87891 Personal history of nicotine dependence: Secondary | ICD-10-CM | POA: Insufficient documentation

## 2017-08-17 DIAGNOSIS — M25562 Pain in left knee: Secondary | ICD-10-CM

## 2017-08-17 NOTE — ED Notes (Signed)
ED Provider at bedside. 

## 2017-08-17 NOTE — Discharge Instructions (Signed)
Your x-ray of the left knee looks normal.  Follow-up with an orthopedist for further evaluation.  You may need an MRI if your symptoms persist.  Continue taking naproxen and Tylenol as needed for pain.

## 2017-08-17 NOTE — ED Triage Notes (Signed)
Left knee pain for several weeks , denies recent injury. Pain worse over the past few days

## 2017-08-17 NOTE — ED Provider Notes (Signed)
[facility] EMERGENCY DEPARTMENT Provider Note   CSN: [id number] Arrival date & time: 08/17/17  [id number]     History   Chief Complaint Chief Complaint  Patient presents with  . Knee Pain    HPI Caitlin Sims is a 47 y.o. female.  HPI Patient presents with several weeks of left knee pain.  No known injury.  States the pain is exacerbated with walking up stairs and pivoting.  States she sometimes feels any gets stuck or gives way.  Patient has chronic low back pain which is unchanged.  No numbness or weakness in the leg.  Patient says she has gained weight over the last few months which she thinks may be worsening her symptoms.  She had no fever or chills.  Question intermittent left knee swelling. Past Medical History:  Diagnosis Date  . Anemia     Patient Active Problem List   Diagnosis Date Noted  . Vitamin D deficiency 02/05/2017  . Other fatigue 05/30/2016  . Leukopenia 09/30/2015  . Iron deficiency anemia 08/13/2015  . S/P bariatric surgery 08/13/2015    Past Surgical History:  Procedure Laterality Date  . CESAREAN SECTION    . LAPAROSCOPIC GASTRIC BANDING  2008     OB History   None      Home Medications    Prior to Admission medications   Medication Sig Start Date End Date Taking? Authorizing Provider  naproxen (NAPROSYN) 500 MG tablet Take 1 tablet (500 mg total) 2 (two) times daily by mouth. 11/12/16   Renne CriglerGeiple, Joshua, PA-C    Family History Family History  Problem Relation Age of Onset  . Cancer Maternal Grandmother        stomach ca    Social History Social History   Tobacco Use  . Smoking status: Former Smoker    Packs/day: 0.25    Years: 15.00    Pack years: 3.75    Types: Cigarettes  . Smokeless tobacco: Current User  . Tobacco comment: Vapour  Substance Use Topics  . Alcohol use: Yes    Alcohol/week: 1.0 standard drinks    Types: 1 Glasses of wine per week  . Drug use: No     Allergies   Patient has no known  allergies.   Review of Systems Review of Systems  Constitutional: Negative for chills and fever.  Musculoskeletal: Positive for arthralgias, back pain and joint swelling. Negative for gait problem, myalgias, neck pain and neck stiffness.  Skin: Negative for rash and wound.  Neurological: Negative for numbness.  All other systems reviewed and are negative.    Physical Exam Updated Vital Signs BP 131/80 (BP Location: Left Arm)   Pulse 70   Temp 98.2 F (36.8 C) (Oral)   Resp 18   Ht 5\' 5"  (1.651 m)   Wt 99.8 kg   LMP 06/24/2016   SpO2 100%   BMI 36.61 kg/m   Physical Exam  Constitutional: She is oriented to person, place, and time. She appears well-developed and well-nourished. No distress.  HENT:  Head: Normocephalic and atraumatic.  Eyes: Pupils are equal, round, and reactive to light. EOM are normal.  Neck: Normal range of motion. Neck supple.  Cardiovascular: Normal rate.  Pulmonary/Chest: Effort normal.  Abdominal: Soft.  Musculoskeletal: Normal range of motion. She exhibits no edema or tenderness.  Full range of motion of the left knee.  No obvious joint effusion.  No ligamentous instability.  No popliteal fossa fullness.  No warmth or erythema.  No definite  tenderness to palpation.  Distal pulses are 2+.  Patient has some mild midline inferior lumbar tenderness to palpation.  Negative straight leg raise bilaterally.  Distal pulses are 2+.  No calf swelling, asymmetry or tenderness.  Neurological: She is alert and oriented to person, place, and time.  5/5 motor in all extremities.  Sensation intact.  Ambulating without difficulty.  Skin: Skin is warm and dry. Capillary refill takes less than 2 seconds. No rash noted. She is not diaphoretic. No erythema.  Psychiatric: She has a normal mood and affect. Her behavior is normal.  Nursing note and vitals reviewed.    ED Treatments / Results  Labs (all labs ordered are listed, but only abnormal results are  displayed) Labs Reviewed - No data to display  EKG None  Radiology Dg Knee Complete 4 Views Left  Result Date: 08/17/2017 CLINICAL DATA:  Left knee pain for a few weeks EXAM: LEFT KNEE - COMPLETE 4+ VIEW COMPARISON:  None. FINDINGS: No evidence of fracture, dislocation, or joint effusion. No evidence of arthropathy or other focal bone abnormality. Soft tissues are unremarkable. IMPRESSION: Negative. Electronically Signed   By: Marnee SpringJonathon  Watts M.D.   On: 08/17/2017 09:25    Procedures Procedures (including critical care time)  Medications Ordered in ED Medications - No data to display   Initial Impression / Assessment and Plan / ED Course  I have reviewed the triage vital signs and the nursing notes.  Pertinent labs & imaging results that were available during my care of the patient were reviewed by me and considered in my medical decision making (see chart for details).     Possible meniscal injury.  Will screen with x-ray.  Patient will likely need to follow-up with orthopedist. X-ray without acute findings. Final Clinical Impressions(s) / ED Diagnoses   Final diagnoses:  Left knee pain, unspecified chronicity    ED Discharge Orders    None       Loren RacerYelverton, Ltanya Bayley, MD 08/17/17 [phone]

## 2017-08-30 ENCOUNTER — Ambulatory Visit (INDEPENDENT_AMBULATORY_CARE_PROVIDER_SITE_OTHER): Payer: 59 | Admitting: Orthopaedic Surgery

## 2017-08-30 ENCOUNTER — Encounter (INDEPENDENT_AMBULATORY_CARE_PROVIDER_SITE_OTHER): Payer: Self-pay | Admitting: Orthopaedic Surgery

## 2017-08-30 DIAGNOSIS — M25562 Pain in left knee: Secondary | ICD-10-CM

## 2017-08-30 DIAGNOSIS — M25561 Pain in right knee: Secondary | ICD-10-CM

## 2017-08-30 DIAGNOSIS — G8929 Other chronic pain: Secondary | ICD-10-CM
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
## 2017-08-31 ENCOUNTER — Other Ambulatory Visit (INDEPENDENT_AMBULATORY_CARE_PROVIDER_SITE_OTHER): Payer: Self-pay

## 2017-08-31 DIAGNOSIS — M25561 Pain in right knee: Secondary | ICD-10-CM

## 2017-08-31 DIAGNOSIS — M25562 Pain in left knee: Principal | ICD-10-CM

## 2017-09-13 ENCOUNTER — Ambulatory Visit (INDEPENDENT_AMBULATORY_CARE_PROVIDER_SITE_OTHER): Payer: 59 | Admitting: Orthopaedic Surgery

## 2017-09-22 ENCOUNTER — Ambulatory Visit
Admission: RE | Admit: 2017-09-22 | Discharge: 2017-09-22 | Disposition: A | Discharge status: Home / Self Care | Payer: 59 | Source: Ambulatory Visit | Attending: Orthopaedic Surgery | Admitting: Orthopaedic Surgery

## 2017-09-22 DIAGNOSIS — M25562 Pain in left knee: Principal | ICD-10-CM

## 2017-09-22 DIAGNOSIS — M25561 Pain in right knee: Secondary | ICD-10-CM

## 2017-09-24 ENCOUNTER — Encounter (INDEPENDENT_AMBULATORY_CARE_PROVIDER_SITE_OTHER): Payer: Self-pay | Admitting: Orthopaedic Surgery

## 2017-09-24 ENCOUNTER — Ambulatory Visit (INDEPENDENT_AMBULATORY_CARE_PROVIDER_SITE_OTHER): Payer: 59 | Admitting: Orthopaedic Surgery

## 2017-09-24 DIAGNOSIS — M25561 Pain in right knee: Secondary | ICD-10-CM

## 2017-09-24 DIAGNOSIS — M25562 Pain in left knee: Secondary | ICD-10-CM | POA: Diagnosis not present

## 2017-09-24 DIAGNOSIS — G8929 Other chronic pain: Secondary | ICD-10-CM | POA: Diagnosis not present
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
## 2017-10-01 ENCOUNTER — Ambulatory Visit (INDEPENDENT_AMBULATORY_CARE_PROVIDER_SITE_OTHER): Payer: 59 | Admitting: Orthopaedic Surgery

## 2018-08-02 ENCOUNTER — Other Ambulatory Visit: Payer: Self-pay

## 2018-08-02 DIAGNOSIS — Z20822 Contact with and (suspected) exposure to covid-19: Secondary | ICD-10-CM

## 2018-08-03 LAB — NOVEL CORONAVIRUS, NAA: SARS-CoV-2, NAA: NOT DETECTED

## 2018-09-10 ENCOUNTER — Other Ambulatory Visit: Payer: Self-pay | Admitting: Registered"

## 2018-09-10 DIAGNOSIS — Z20822 Contact with and (suspected) exposure to covid-19: Secondary | ICD-10-CM

## 2018-09-12 LAB — NOVEL CORONAVIRUS, NAA: SARS-CoV-2, NAA: NOT DETECTED

## 2019-03-29 ENCOUNTER — Emergency Department (HOSPITAL_COMMUNITY)
Admission: EM | Admit: 2019-03-29 | Discharge: 2019-03-29 | Disposition: A | Discharge status: Home / Self Care | Payer: 59 | Attending: Emergency Medicine | Admitting: Emergency Medicine

## 2019-03-29 ENCOUNTER — Encounter (HOSPITAL_COMMUNITY): Payer: Self-pay | Admitting: Emergency Medicine

## 2019-03-29 ENCOUNTER — Other Ambulatory Visit: Payer: Self-pay

## 2019-03-29 ENCOUNTER — Emergency Department (HOSPITAL_COMMUNITY): Payer: 59

## 2019-03-29 DIAGNOSIS — F17210 Nicotine dependence, cigarettes, uncomplicated: Secondary | ICD-10-CM | POA: Diagnosis not present

## 2019-03-29 DIAGNOSIS — R079 Chest pain, unspecified: Secondary | ICD-10-CM | POA: Insufficient documentation

## 2019-03-29 LAB — BASIC METABOLIC PANEL
Anion gap: 12 (ref 5–15)
BUN: 14 mg/dL (ref 6–20)
CO2: 23 mmol/L (ref 22–32)
Calcium: 9 mg/dL (ref 8.9–10.3)
Chloride: 103 mmol/L (ref 98–111)
Creatinine, Ser: 0.93 mg/dL (ref 0.44–1.00)
GFR calc Af Amer: >60 mL/min (ref >60)
GFR calc non Af Amer: >60 mL/min (ref >60)
Glucose, Bld: 92 mg/dL (ref 70–99)
Potassium: 3.5 mmol/L (ref 3.5–5.1)
Sodium: 138 mmol/L (ref 135–145)

## 2019-03-29 LAB — HEPATIC FUNCTION PANEL
ALT: 13 U/L (ref 0–44)
AST: 19 U/L (ref 15–41)
Albumin: 4.1 g/dL (ref 3.5–5.0)
Alkaline Phosphatase: 70 U/L (ref 38–126)
Bilirubin, Direct: <0.1 mg/dL (ref 0.0–0.2)
Total Bilirubin: 0.7 mg/dL (ref 0.3–1.2)
Total Protein: 7.6 g/dL (ref 6.5–8.1)

## 2019-03-29 LAB — CBC
HCT: 41.4 % (ref 36.0–46.0)
Hemoglobin: 12.7 g/dL (ref 12.0–15.0)
MCH: 26.7 pg (ref 26.0–34.0)
MCHC: 30.7 g/dL (ref 30.0–36.0)
MCV: 87.2 fL (ref 80.0–100.0)
Platelets: 215 10*3/uL (ref 150–400)
RBC: 4.75 MIL/uL (ref 3.87–5.11)
RDW: 13.4 % (ref 11.5–15.5)
WBC: 3.6 10*3/uL — ABNORMAL LOW (ref 4.0–10.5)
nRBC: 0 % (ref 0.0–0.2)

## 2019-03-29 LAB — I-STAT BETA HCG BLOOD, ED (MC, WL, AP ONLY): I-stat hCG, quantitative: <5 m[IU]/mL (ref <5)

## 2019-03-29 LAB — TROPONIN I (HIGH SENSITIVITY)
Troponin I (High Sensitivity): 3 ng/L (ref <18)
Troponin I (High Sensitivity): 3 ng/L (ref <18)

## 2019-03-29 LAB — LIPASE, BLOOD: Lipase: 21 U/L (ref 11–51)

## 2019-03-29 MED ORDER — SODIUM CHLORIDE 0.9% FLUSH: 3.0000 mL | Freq: Once | INTRAVENOUS | Status: DC

## 2019-03-29 MED ORDER — PANTOPRAZOLE SODIUM 20 MG PO TBEC: 20.0000 mg | DELAYED_RELEASE_TABLET | Freq: Every day | ORAL | 0 refills | Status: DC

## 2019-03-29 NOTE — ED Provider Notes (Signed)
[facility] EMERGENCY DEPARTMENT Provider Note   CSN: [id number] Arrival date & time: 03/29/19  1226     History Chief Complaint  Patient presents with  . Chest Pain    Caitlin Sims is a 49 y.o. female with PMHx anemia who presents to the ED today complaining of gradual onset, intermittent, upper bilateral chest pain x 3 days.  She endorses that on Wednesday 2/24 she was seen by her bariatric doctor Dr. Cliffton Asters and [facility] after having issues with dysphagia.  And had a UGI performed on 2/15 that did show dilated proximal esophagus and minimal contrast getting through the band.  He had 2 cc of fluid drained from the band which left a total of 4 cc and was discharged home.  She states that the next day she felt upper bilateral chest pain that lasted approximately 15 minutes before subsiding.  Patient cannot recall what she ate prior to her symptoms beginning but states the symptoms began several hours afterwards.  She states the symptoms have been occurring in the morning and waking her up from her sleep.  She is concerned that she could be having issues from the gastric band deflation.  Denies any nausea, vomiting, shortness of breath.  Story of DVT/PE.  No recent prolonged travel or immobilization.  No exogenous hormone use.  No active malignancy.  Does endorse family history of CAD in their 43s.  He is not currently having any active chest pain.  States that the most recent chest pain happened at approximately 11 AM today.   The history is provided by the patient and medical records.       Past Medical History:  Diagnosis Date  . Anemia     Patient Active Problem List   Diagnosis Date Noted  . Chronic pain of right knee 09/24/2017  . Vitamin D deficiency 02/05/2017  . Other fatigue 05/30/2016  . Leukopenia 09/30/2015  . Iron deficiency anemia 08/13/2015  . S/P bariatric surgery 08/13/2015    Past Surgical History:  Procedure Laterality Date   . CESAREAN SECTION    . LAPAROSCOPIC GASTRIC BANDING  2008     OB History   No obstetric history on file.     Family History  Problem Relation Age of Onset  . Cancer Maternal Grandmother        stomach ca    Social History   Tobacco Use  . Smoking status: Current Some Day Smoker    Packs/day: 0.25    Years: 15.00    Pack years: 3.75    Types: Cigarettes, Cigars  . Smokeless tobacco: Current User  . Tobacco comment: Vapour  Substance Use Topics  . Alcohol use: Yes    Alcohol/week: 1.0 standard drinks    Types: 1 Glasses of wine per week  . Drug use: No    Home Medications Prior to Admission medications   Medication Sig Start Date End Date Taking? Authorizing Provider  Cyanocobalamin (VITAMIN B-12 PO) Take 2 tablets by mouth daily.   Yes [provider]  ibuprofen (ADVIL) 200 MG tablet Take 400 mg by mouth every 6 (six) hours as needed for headache (pain).   Yes [provider]  phentermine (ADIPEX-P) 37.5 MG tablet Take 37.5 mg by mouth daily. 03/21/19  Yes [provider]  topiramate (TOPAMAX) 25 MG tablet Take 25 mg by mouth daily. 03/21/19  Yes [provider]  pantoprazole (PROTONIX) 20 MG tablet Take 1 tablet (20 mg total) by  mouth daily. 03/29/19 04/28/19  Eustaquio Maize, PA-C    Allergies    Patient has no known allergies.  Review of Systems   Review of Systems  Constitutional: Negative for chills and fever.  Respiratory: Negative for cough.   Cardiovascular: Positive for chest pain.  Gastrointestinal: Negative for abdominal pain, nausea and vomiting.  All other systems reviewed and are negative.   Physical Exam Updated Vital Signs BP 139/76   Pulse (!) 56   Temp 97.8 F (36.6 C) (Oral)   Resp 13   LMP 06/24/2016   SpO2 100%   Physical Exam Vitals and nursing note reviewed.  Constitutional:      Appearance: She is not ill-appearing or diaphoretic.  HENT:     Head: Normocephalic and atraumatic.  Eyes:      Conjunctiva/sclera: Conjunctivae normal.  Cardiovascular:     Rate and Rhythm: Normal rate and regular rhythm.     Pulses:          Radial pulses are 2+ on the right side and 2+ on the left side.       Dorsalis pedis pulses are 2+ on the right side and 2+ on the left side.  Pulmonary:     Effort: Pulmonary effort is normal.     Breath sounds: Normal breath sounds. No decreased breath sounds, wheezing, rhonchi or rales.  Chest:     Chest wall: No tenderness.  Abdominal:     Palpations: Abdomen is soft.     Tenderness: There is no abdominal tenderness. There is no guarding or rebound.  Musculoskeletal:     Cervical back: Neck supple.     Right lower leg: No edema.     Left lower leg: No edema.  Skin:    General: Skin is warm and dry.  Neurological:     Mental Status: She is alert.     ED Results / Procedures / Treatments   Labs (all labs ordered are listed, but only abnormal results are displayed) Labs Reviewed  CBC - Abnormal; Notable for the following components:      Result Value   WBC 3.6 (*)    All other components within normal limits  BASIC METABOLIC PANEL  LIPASE, BLOOD  HEPATIC FUNCTION PANEL  I-STAT BETA HCG BLOOD, ED ([facility], [facility], [facility] ONLY)  TROPONIN I (HIGH SENSITIVITY)  TROPONIN I (HIGH SENSITIVITY)    EKG EKG Interpretation  Date/Time:  Saturday March 29 2019 12:37:58 EDT Ventricular Rate:  68 PR Interval:  158 QRS Duration: 74 QT Interval:  424 QTC Calculation: 450 R Axis:   64 Text Interpretation: Normal sinus rhythm Nonspecific T wave abnormality Abnormal ECG no prior available for comparison Confirmed by Quintella Reichert [phone]) on 03/29/2019 1:09:52 PM   Radiology DG Chest 2 View  Result Date: 03/29/2019 CLINICAL DATA:  Intermittent chest pain EXAM: CHEST - 2 VIEW COMPARISON:  None. FINDINGS: The heart size and mediastinal contours are within normal limits. Both lungs are clear. The visualized skeletal structures are unremarkable. IMPRESSION: No  active cardiopulmonary disease. Electronically Signed   By: Abelardo Diesel M.D.   On: 03/29/2019 13:25    Procedures Procedures (including critical care time)  Medications Ordered in ED Medications  sodium chloride flush (NS) 0.9 % injection 3 mL (has no administration in time range)    ED Course  I have reviewed the triage vital signs and the nursing notes.  Pertinent labs & imaging results that were available during my care of the patient were reviewed  by me and considered in my medical decision making (see chart for details).    MDM Rules/Calculators/A&P                      49 year old female who presents to the ED today complaining of intermittent bilateral upper chest pain x3 days.  Had 2 cc of fluid drained off of her gastric band a day prior to her symptoms beginning.  Patient is concerned this could be causing her symptoms however she denies any abdominal pain, nausea, vomiting.  Able to the ED patient is afebrile, nontachycardic and nontachypneic.  Her blood pressure is elevated however she denies any active chest pain at this time.  No history of hypertension.  Continue to monitor.  Patient does endorse family history of CAD.  Will work-up for this at this time. She is PERC negative. Equal pulses; doubt dissection.   Have attempted to consult pt's bariatric physician however given it is the weekend unable to reach. Have discussed case with attending physician Dr. Madilyn Hook who will touch base with our general surgeon to determine if pt could expect chest pain from her procedure.   EKG with nonspecific t wave abnormalities; no baseline for comparison.  CXR negative.   CBC with leukopenia however similar to baseline. Hgb stable.  BMP without electrolyte abnormalities.  LFTs unremarkable.  Lipase 21.  Beta HCG negative.   Blood pressure has improved to 139/76.   Initial troponin of 3 and repeat of 3. Pt continues to be chest pain free in the ED today. Dr. Madilyn Hook has discussed case  with Dr. Laurell Josephs who does not expect her chest pain to be related to her procedure. Her labwork is unremarkable at this time. Do not feel she needs admission at this time. Have advised pt follow up with her PCP and bariatric physician. Have placed ambulatory referral to cardiology given nonspecific chest pain. Strict return precautions have been discussed with pt. She is in agreement with plan and stable for discharge home at this time. Will discharge with protonix to see if this helps pt's symptoms.   This note was prepared using Dragon voice recognition software and may include unintentional dictation errors due to the inherent limitations of voice recognition software.  Final Clinical Impression(s) / ED Diagnoses Final diagnoses:  Nonspecific chest pain    Rx / DC Orders ED Discharge Orders         Ordered    pantoprazole (PROTONIX) 20 MG tablet  Daily     03/29/19 1558           Discharge Instructions     Your labwork was very reassuring today. Please pick up medication and take as prescribed as this may help your symptoms.   Follow up with your GI doctor and your PCP for further evaluation.   Return to the ED IMMEDIATELY for any worsening symptoms including worsening chest pain, pain going directly into your back from your chest, weakness/numbness/tingling on one side of your body, vomiting, vomiting blood, passing out, or any other concerning symptoms.        Tanda Rockers, PA-C 03/29/19 1609    Tilden Fossa, MD 03/29/19 [phone]

## 2019-03-29 NOTE — Discharge Instructions (Addendum)
Your labwork was very reassuring today. Please pick up medication and take as prescribed as this may help your symptoms. I have placed an ambulatory referral to cardiology - they will call to schedule an appointment.   Follow up with your GI doctor and your PCP for further evaluation.   Return to the ED IMMEDIATELY for any worsening symptoms including worsening chest pain, pain going directly into your back from your chest, weakness/numbness/tingling on one side of your body, vomiting, vomiting blood, passing out, or any other concerning symptoms.

## 2019-03-29 NOTE — ED Triage Notes (Signed)
Pt to triage via [facility] from [facility].  Reports intermittent chest pain since Thursday with SOB, diaphoresis, and feeling lightheaded.  Denies pain at present.  Pt had lap band adjusted on Wednesday.  EMS administered 4 baby ASA.

## 2019-04-14 NOTE — Progress Notes (Signed)
Cardiology Office Note:    Date:  04/15/2019   ID:  Caitlin Sims, DOB 1970-06-23, MRN [id number]  PCP:  Eliezer Lofts, MD  Cardiologist:  Little Ishikawa, MD  Electrophysiologist:  None   Referring MD: Tanda Rockers, PA-C   Chief Complaint  Patient presents with  . Chest Pain    History of Present Illness:    Caitlin Sims is a 49 y.o. female with a hx of obesity status post gastric banding who presents as an ED follow-up for chest pain.  She reports that she has had recent issues with dysphagia and underwent fluoroscopy on 02/17/2019, which showed gastric lap band appearing tight with very little contrast passing through resulting in dilatation of the esophagus.  She was seen by her bariatric surgeon, Dr. Cliffton Asters at [facility] on 03/26/2019.  2 cc was removed from her band, with improvement in symptoms of dysphagia following this.  However, she reports that day following that she began having chest pain.  She presented to the ED on 03/29/2019.  High-sensitivity troponins 3 ->3.  She was referred to cardiology for further evaluation.  Underwent gastric banding in 2008, lost 100 lbs. Reports that 3/25 she woke up with chest pain.  She described as pressure across her entire chest.  Was diaphoretic during the episode.  Episode lasted about 15 minutes.  She had several episodes of similar chest pain over the following 3 days.  Occurred at rest.  She is active, plays tennis and jogs, will jog for up to 2 miles.  Has gained 46 pounds since last year.  She denies any exertional chest pain or dyspnea.  As part of her work-up for gastric banding, states that she was told she had a leaky heart valve.  Smokes 2-3 cigars weekly. Father died of heart failure.      Past Medical History:  Diagnosis Date  . Anemia     Past Surgical History:  Procedure Laterality Date  . CESAREAN SECTION    . LAPAROSCOPIC GASTRIC BANDING  2008    Current Medications: Current Meds  Medication Sig  .  Cyanocobalamin (VITAMIN B-12 PO) Take 2 tablets by mouth daily.  . [DISCONTINUED] topiramate (TOPAMAX) 25 MG tablet Take 25 mg by mouth daily.     Allergies:   Patient has no known allergies.   Social History   Socioeconomic History  . Marital status: Legally Separated    Spouse name: Not on file  . Number of children: 2  . Years of education: Not on file  . Highest education level: Not on file  Occupational History  . Occupation: Museum/gallery exhibitions officer  Tobacco Use  . Smoking status: Current Some Day Smoker    Packs/day: 0.25    Years: 15.00    Pack years: 3.75    Types: Cigarettes, Cigars  . Smokeless tobacco: Current User  . Tobacco comment: Vapour  Substance and Sexual Activity  . Alcohol use: Yes    Alcohol/week: 1.0 standard drinks    Types: 1 Glasses of wine per week  . Drug use: No  . Sexual activity: Not on file  Other Topics Concern  . Not on file  Social History Narrative  . Not on file   Social Determinants of Health   Financial Resource Strain:   . Difficulty of Paying Living Expenses:   Food Insecurity:   . Worried About Programme researcher, broadcasting/film/video in the Last Year:   . [employer] of Food in the Last Year:  Transportation Needs:   . Freight forwarder (Medical):   Marland Kitchen Lack of Transportation (Non-Medical):   Physical Activity:   . Days of Exercise per Week:   . Minutes of Exercise per Session:   Stress:   . Feeling of Stress :   Social Connections:   . Frequency of Communication with Friends and Family:   . Frequency of Social Gatherings with Friends and Family:   . Attends Religious Services:   . Active Member of Clubs or Organizations:   . Attends Banker Meetings:   Marland Kitchen Marital Status:      Family History: The patient's family history includes Cancer in her maternal grandmother.  ROS:   Please see the history of present illness.     All other systems reviewed and are negative.  EKGs/Labs/Other Studies Reviewed:    The following studies  were reviewed today:   EKG:  EKG is ordered today.  The ekg ordered today demonstrates normal sinus rhythm, rate 88, nonspecific T wave flattening  Recent Labs: 03/29/2019: ALT 13; BUN 14; Creatinine, Ser 0.93; Hemoglobin 12.7; Platelets 215; Potassium 3.5; Sodium 138  Recent Lipid Panel No results found for: CHOL, TRIG, HDL, CHOLHDL, VLDL, LDLCALC, LDLDIRECT  Physical Exam:    VS:  BP 132/80 (BP Location: Right Arm, Patient Position: Sitting, Cuff Size: Large)   Pulse 88   Ht 5\' 5"  (1.651 m)   Wt 222 lb (100.7 kg)   LMP 06/24/2016   BMI 36.94 kg/m     Wt Readings from Last 3 Encounters:  04/15/19 222 lb (100.7 kg)  08/17/17 220 lb 0.3 oz (99.8 kg)  11/12/16 204 lb (92.5 kg)     GEN:  Well nourished, well developed in no acute distress HEENT: Normal NECK: No JVD; No carotid bruits LYMPHATICS: No lymphadenopathy CARDIAC: RRR, no murmurs, rubs, gallops RESPIRATORY:  Clear to auscultation without rales, wheezing or rhonchi  ABDOMEN: Soft, non-tender, non-distended MUSCULOSKELETAL:  No edema; No deformity  SKIN: Warm and dry NEUROLOGIC:  Alert and oriented x 3 PSYCHIATRIC:  Normal affect   ASSESSMENT:    1. Chest pain of uncertain etiology   2. Tobacco use   3. Valvular regurgitation    PLAN:     Chest pain: Atypical in description.  Risk factors for CAD include tobacco use.  Would classify as low risk for obstructive CAD.  She is able to exercise and EKG is interpretable.  Will evaluate further with ETT  ?Valvular regurgitation: reports was told she had leaky heart valve as part of her work-up for gastric banding in 2008.  Will check TTE  Tobacco use: patient was counseled on the risk of tobacco use and cessation was strongly encouraged  RTC in 6 weeks  Medication Adjustments/Labs and Tests Ordered: Current medicines are reviewed at length with the patient today.  Concerns regarding medicines are outlined above.  Orders Placed This Encounter  Procedures  .  EXERCISE TOLERANCE TEST (ETT)  . EKG 12-Lead  . ECHOCARDIOGRAM COMPLETE   No orders of the defined types were placed in this encounter.   Patient Instructions  Medication Instructions:  Your physician recommends that you continue on your current medications as directed. Please refer to the Current Medication list given to you today.  Lab Work: NONE  Testing/Procedures: Your physician has requested that you have an echocardiogram. Echocardiography is a painless test that uses sound waves to create images of your heart. It provides your doctor with information about the size and shape of  your heart and how well your heart's chambers and valves are working. This procedure takes approximately one hour. There are no restrictions for this procedure. This will be done at our [facility] location:  [place] has requested that you have an exercise tolerance test. For further information please visit [email or web address]. Please also follow instruction sheet, as given. --you will need a covid test 3 days prior, we will schedule this for you.    Follow-Up: At [facility], you and your health needs are our priority.  As part of our continuing mission to provide you with exceptional heart care, we have created designated Provider Care Teams.  These Care Teams include your primary Cardiologist (physician) and Advanced Practice Providers (APPs -  Physician Assistants and Nurse Practitioners) who all work together to provide you with the care you need, when you need it.  We recommend signing up for the patient portal called "MyChart".  Sign up information is provided on this After Visit Summary.  MyChart is used to connect with patients for Virtual Visits (Telemedicine).  Patients are able to view lab/test results, encounter notes, upcoming appointments, etc.  Non-urgent messages can be sent to your provider as well.   To learn more about what you can do with  MyChart, go to [email or web address].    Your next appointment:   6 week(s)  The format for your next appointment:   In Person  Provider:   Oswaldo Milian, MD        Signed, Donato Heinz, MD  04/15/2019 10:55 PM    [place]

## 2019-04-15 ENCOUNTER — Ambulatory Visit: Payer: 59 | Admitting: Cardiology

## 2019-04-15 ENCOUNTER — Other Ambulatory Visit: Payer: Self-pay

## 2019-04-15 ENCOUNTER — Encounter: Payer: Self-pay | Admitting: Cardiology

## 2019-04-15 VITALS — BP 132/80 | HR 88 | Ht 65.0 in | Wt 222.0 lb

## 2019-04-15 DIAGNOSIS — I38 Endocarditis, valve unspecified: Secondary | ICD-10-CM

## 2019-04-15 DIAGNOSIS — R079 Chest pain, unspecified: Secondary | ICD-10-CM | POA: Diagnosis not present

## 2019-04-15 DIAGNOSIS — Z720 Tobacco use: Secondary | ICD-10-CM | POA: Diagnosis not present

## 2019-04-15 NOTE — Patient Instructions (Signed)
Medication Instructions:  Your physician recommends that you continue on your current medications as directed. Please refer to the Current Medication list given to you today.  Lab Work: NONE  Testing/Procedures: Your physician has requested that you have an echocardiogram. Echocardiography is a painless test that uses sound waves to create images of your heart. It provides your doctor with information about the size and shape of your heart and how well your heart's chambers and valves are working. This procedure takes approximately one hour. There are no restrictions for this procedure. This will be done at our [facility] location:  [address]  Your physician has requested that you have an exercise tolerance test. For further information please visit [email or web address] Please also follow instruction sheet, as given. --you will need a covid test 3 days prior, we will schedule this for you.    Follow-Up: At [facility], you and your health needs are our priority.  As part of our continuing mission to provide you with exceptional heart care, we have created designated Provider Care Teams.  These Care Teams include your primary Cardiologist (physician) and Advanced Practice Providers (APPs -  Physician Assistants and Nurse Practitioners) who all work together to provide you with the care you need, when you need it.  We recommend signing up for the patient portal called "MyChart".  Sign up information is provided on this After Visit Summary.  MyChart is used to connect with patients for Virtual Visits (Telemedicine).  Patients are able to view lab/test results, encounter notes, upcoming appointments, etc.  Non-urgent messages can be sent to your provider as well.   To learn more about what you can do with MyChart, go to [email or web address].    Your next appointment:   6 week(s)  The format for your next appointment:   In Person  Provider:   Epifanio Lesches,  MD

## 2019-04-24 ENCOUNTER — Other Ambulatory Visit: Payer: Self-pay

## 2019-04-24 ENCOUNTER — Ambulatory Visit (HOSPITAL_COMMUNITY): Payer: 59 | Attending: Cardiology

## 2019-04-24 DIAGNOSIS — R079 Chest pain, unspecified: Secondary | ICD-10-CM | POA: Diagnosis present

## 2019-04-24 MED ORDER — PERFLUTREN LIPID MICROSPHERE
1.0000 mL | INTRAVENOUS | Status: AC | PRN
  Administered 2019-04-24: 1 mL via INTRAVENOUS

## 2019-04-29 ENCOUNTER — Other Ambulatory Visit (HOSPITAL_COMMUNITY)
Admission: RE | Admit: 2019-04-29 | Discharge: 2019-04-29 | Disposition: A | Discharge status: Home / Self Care | Payer: 59 | Source: Ambulatory Visit | Attending: Cardiology | Admitting: Cardiology

## 2019-04-29 DIAGNOSIS — Z20822 Contact with and (suspected) exposure to covid-19: Secondary | ICD-10-CM | POA: Insufficient documentation

## 2019-04-29 DIAGNOSIS — Z01812 Encounter for preprocedural laboratory examination: Secondary | ICD-10-CM | POA: Diagnosis present

## 2019-04-29 LAB — SARS CORONAVIRUS 2 (TAT 6-24 HRS): SARS Coronavirus 2: NEGATIVE

## 2019-04-30 ENCOUNTER — Telehealth (HOSPITAL_COMMUNITY): Payer: Self-pay

## 2019-04-30 NOTE — Telephone Encounter (Signed)
Encounter complete. 

## 2019-05-01 ENCOUNTER — Telehealth (HOSPITAL_COMMUNITY): Payer: Self-pay

## 2019-05-01 NOTE — Telephone Encounter (Signed)
Encounter complete. 

## 2019-05-02 ENCOUNTER — Other Ambulatory Visit: Payer: Self-pay

## 2019-05-02 ENCOUNTER — Ambulatory Visit (HOSPITAL_COMMUNITY)
Admission: RE | Admit: 2019-05-02 | Discharge: 2019-05-02 | Disposition: A | Discharge status: Home / Self Care | Payer: 59 | Source: Ambulatory Visit | Attending: Cardiovascular Disease | Admitting: Cardiovascular Disease

## 2019-05-02 DIAGNOSIS — R079 Chest pain, unspecified: Secondary | ICD-10-CM | POA: Insufficient documentation

## 2019-05-02 LAB — EXERCISE TOLERANCE TEST
Estimated workload: 10.4 METS
Exercise duration (min): 9 min
Exercise duration (sec): 0 s
MPHR: 172 {beats}/min
Peak HR: 151 {beats}/min
Percent HR: 87 %
Rest HR: 93 {beats}/min

## 2019-05-07 ENCOUNTER — Telehealth: Payer: Self-pay | Admitting: Cardiology

## 2019-05-07 NOTE — Telephone Encounter (Signed)
Patient made aware of results and verbalized understanding.   ECHO: No significant abnormalities. Mild leakage of mitral valve.  ETT: Normal stress test

## 2019-05-07 NOTE — Telephone Encounter (Signed)
   Pt would like to speak with [facility] regarding results  Please call

## 2019-05-29 ENCOUNTER — Ambulatory Visit: Payer: 59 | Admitting: Cardiology

## 2019-05-29 ENCOUNTER — Other Ambulatory Visit: Payer: Self-pay

## 2019-05-29 ENCOUNTER — Encounter: Payer: Self-pay | Admitting: Cardiology

## 2019-05-29 VITALS — BP 132/70 | HR 77 | Ht 65.0 in | Wt 233.6 lb

## 2019-05-29 DIAGNOSIS — I340 Nonrheumatic mitral (valve) insufficiency: Secondary | ICD-10-CM

## 2019-05-29 DIAGNOSIS — Z720 Tobacco use: Secondary | ICD-10-CM | POA: Diagnosis not present

## 2019-05-29 DIAGNOSIS — R079 Chest pain, unspecified: Secondary | ICD-10-CM

## 2019-05-29 NOTE — Patient Instructions (Signed)

## 2019-05-29 NOTE — Progress Notes (Signed)
Cardiology Office Note:    Date:  05/29/2019   ID:  Caitlin Sims, DOB 06/20/1970, MRN [id number]  PCP:  Eliezer Lofts, MD  Cardiologist:  Little Ishikawa, MD  Electrophysiologist:  None   Referring MD: Eliezer Lofts, MD   Chief Complaint  Patient presents with  . Chest Pain    History of Present Illness:    Caitlin Sims is a 49 y.o. female with a hx of obesity status post gastric banding who presents for follow-up.  She was initially seen as an ED follow-up for chest pain, with initial appointment on 04/15/2019.  She reports that she had issues with dysphagia and underwent fluoroscopy on 02/17/2019, which showed gastric lap band appearing tight with very little contrast passing through resulting in dilatation of the esophagus.  She was seen by her bariatric surgeon, Dr. Cliffton Asters at [facility] on 03/26/2019.  2 cc was removed from her band, with improvement in symptoms of dysphagia following this.  However, she reports that day following that she began having chest pain.  She presented to the ED on 03/29/2019.  High-sensitivity troponins 3 ->3.  She was referred to cardiology for further evaluation.  Underwent gastric banding in 2008, lost 100 lbs. Reports that 03/27/19 she woke up with chest pain.  She described as pressure across her entire chest.  Was diaphoretic during the episode.  Episode lasted about 15 minutes.  She had several episodes of similar chest pain over the following 3 days.  Occurred at rest.  She is active, plays tennis and jogs, will jog for up to 2 miles.  Gained 46 pounds in the past year.  She denies any exertional chest pain or dyspnea.  As part of her work-up for gastric banding, states that she was told she had a leaky heart valve.  Smokes 2-3 cigars weekly. Father died of heart failure.     TTE on 04/24/2019 showed EF 55% (distal septal hypokinesis), normal RV function, mild mitral regurgitation.  ETT on 05/02/2019 showed good exercise capacity (10.4 METS),  no evidence of ischemia.  Since last clinic visit, she reports that she has been doing well.  She denies any further chest pain or dyspnea.  She jogs 2.25 miles 2-3 times per week and plays tennis once per week.  She continues to smoke 1 to 2 cigars weekly.    Past Medical History:  Diagnosis Date  . Anemia     Past Surgical History:  Procedure Laterality Date  . CESAREAN SECTION    . LAPAROSCOPIC GASTRIC BANDING  2008    Current Medications: No outpatient medications have been marked as taking for the 05/29/19 encounter (Office Visit) with Little Ishikawa, MD.     Allergies:   Patient has no known allergies.   Social History   Socioeconomic History  . Marital status: Legally Separated    Spouse name: Not on file  . Number of children: 2  . Years of education: Not on file  . Highest education level: Not on file  Occupational History  . Occupation: Museum/gallery exhibitions officer  Tobacco Use  . Smoking status: Current Some Day Smoker    Packs/day: 0.25    Years: 15.00    Pack years: 3.75    Types: Cigarettes, Cigars  . Smokeless tobacco: Current User  . Tobacco comment: Vapour  Substance and Sexual Activity  . Alcohol use: Yes    Alcohol/week: 1.0 standard drinks    Types: 1 Glasses of wine per week  .  Drug use: No  . Sexual activity: Not on file  Other Topics Concern  . Not on file  Social History Narrative  . Not on file   Social Determinants of Health   Financial Resource Strain:   . Difficulty of Paying Living Expenses:   Food Insecurity:   . Worried About Charity fundraiser in the Last Year:   . Arboriculturist in the Last Year:   Transportation Needs:   . Film/video editor (Medical):   Marland Kitchen Lack of Transportation (Non-Medical):   Physical Activity:   . Days of Exercise per Week:   . Minutes of Exercise per Session:   Stress:   . Feeling of Stress :   Social Connections:   . Frequency of Communication with Friends and Family:   . Frequency of Social  Gatherings with Friends and Family:   . Attends Religious Services:   . Active Member of Clubs or Organizations:   . Attends Archivist Meetings:   Marland Kitchen Marital Status:      Family History: The patient's family history includes Cancer in her maternal grandmother.  ROS:   Please see the history of present illness.     All other systems reviewed and are negative.  EKGs/Labs/Other Studies Reviewed:    The following studies were reviewed today:   EKG:  EKG is not ordered today.  The ekg ordered most recently demonstrates normal sinus rhythm, rate 88, nonspecific T wave flattening  Recent Labs: 03/29/2019: ALT 13; BUN 14; Creatinine, Ser 0.93; Hemoglobin 12.7; Platelets 215; Potassium 3.5; Sodium 138  Recent Lipid Panel No results found for: CHOL, TRIG, HDL, CHOLHDL, VLDL, LDLCALC, LDLDIRECT  Physical Exam:    VS:  BP 132/70   Pulse 77   Ht 5\' 5"  (1.651 m)   Wt 233 lb 9.6 oz (106 kg)   LMP 06/24/2016   SpO2 98%   BMI 38.87 kg/m     Wt Readings from Last 3 Encounters:  05/29/19 233 lb 9.6 oz (106 kg)  04/15/19 222 lb (100.7 kg)  08/17/17 220 lb 0.3 oz (99.8 kg)     GEN:  Well nourished, well developed in no acute distress HEENT: Normal NECK: No JVD; No carotid bruits CARDIAC: RRR, no murmurs, rubs, gallops RESPIRATORY:  Clear to auscultation without rales, wheezing or rhonchi  ABDOMEN: Soft, non-tender, non-distended MUSCULOSKELETAL:  No edema; No deformity  SKIN: Warm and dry NEUROLOGIC:  Alert and oriented x 3 PSYCHIATRIC:  Normal affect   ASSESSMENT:    1. Chest pain of uncertain etiology   2. Tobacco use   3. Mitral valve insufficiency, unspecified etiology    PLAN:    Chest pain: Atypical in description.  ETT shows no evidence of ischemia.  No further work-up recommended at this time.  Mitral regurgitation: reports was told she had leaky heart valve as part of her work-up for gastric banding in 2008.  TTE 04/24/2019 showed mild MR  Tobacco use:  patient was counseled on the risk of tobacco use and cessation was strongly encouraged  RTC in 1 year  Medication Adjustments/Labs and Tests Ordered: Current medicines are reviewed at length with the patient today.  Concerns regarding medicines are outlined above.  No orders of the defined types were placed in this encounter.  No orders of the defined types were placed in this encounter.   Patient Instructions   Medication Instructions:  Your physician recommends that you continue on your current medications as directed. Please refer  to the Current Medication list given to you today.  Follow-Up: At [facility], you and your health needs are our priority.  As part of our continuing mission to provide you with exceptional heart care, we have created designated Provider Care Teams.  These Care Teams include your primary Cardiologist (physician) and Advanced Practice Providers (APPs -  Physician Assistants and Nurse Practitioners) who all work together to provide you with the care you need, when you need it.  We recommend signing up for the patient portal called "MyChart".  Sign up information is provided on this After Visit Summary.  MyChart is used to connect with patients for Virtual Visits (Telemedicine).  Patients are able to view lab/test results, encounter notes, upcoming appointments, etc.  Non-urgent messages can be sent to your provider as well.   To learn more about what you can do with MyChart, go to [email or web address].    Your next appointment:   12 month(s)  The format for your next appointment:   In Person  Provider:   Epifanio Lesches, MD        Signed, Little Ishikawa, MD  05/29/2019 11:21 PM    [facility]

## 2019-12-24 ENCOUNTER — Other Ambulatory Visit: Payer: Self-pay

## 2019-12-24 ENCOUNTER — Emergency Department (HOSPITAL_BASED_OUTPATIENT_CLINIC_OR_DEPARTMENT_OTHER)
Admission: EM | Admit: 2019-12-24 | Discharge: 2019-12-24 | Disposition: A | Discharge status: Home / Self Care | Payer: 59 | Attending: Emergency Medicine | Admitting: Emergency Medicine

## 2019-12-24 ENCOUNTER — Encounter (HOSPITAL_BASED_OUTPATIENT_CLINIC_OR_DEPARTMENT_OTHER): Payer: Self-pay

## 2019-12-24 DIAGNOSIS — M25562 Pain in left knee: Secondary | ICD-10-CM | POA: Diagnosis not present

## 2019-12-24 DIAGNOSIS — Z87891 Personal history of nicotine dependence: Secondary | ICD-10-CM | POA: Insufficient documentation

## 2019-12-24 MED ORDER — IBUPROFEN 600 MG PO TABS: 600.0000 mg | ORAL_TABLET | Freq: Four times a day (QID) | ORAL | 0 refills | Status: AC | PRN

## 2019-12-24 MED ORDER — IBUPROFEN 600 MG PO TABS: 600.0000 mg | ORAL_TABLET | Freq: Four times a day (QID) | ORAL | 0 refills | Status: DC | PRN

## 2019-12-24 MED ORDER — CYCLOBENZAPRINE HCL 10 MG PO TABS: 10.0000 mg | ORAL_TABLET | Freq: Two times a day (BID) | ORAL | 0 refills | Status: DC | PRN

## 2019-12-24 MED ORDER — IBUPROFEN 800 MG PO TABS
800.0000 mg | ORAL_TABLET | Freq: Once | ORAL | Status: AC
  Administered 2019-12-24: 800 mg via ORAL
  Filled 2019-12-24: qty 1

## 2019-12-24 MED ORDER — CYCLOBENZAPRINE HCL 10 MG PO TABS: 10.0000 mg | ORAL_TABLET | Freq: Two times a day (BID) | ORAL | 0 refills | Status: AC | PRN

## 2019-12-24 NOTE — ED Triage Notes (Signed)
Pt c/o left knee pain/swelling x 2 days-denies injury-NAD-steady gait

## 2019-12-24 NOTE — Discharge Instructions (Signed)
Your knee pain is likely due to arthritis.  Applied Ace wrap for support.  Keep your knee elevated when resting.  Alternate between heat and ice for comfort.  Take ibuprofen and muscle relaxant as needed for pain.  If your pain persists to follow-up with orthopedist for further care.  If you develop swelling or painful calf or having shortness of breath please return.

## 2019-12-24 NOTE — ED Provider Notes (Signed)
[facility] EMERGENCY DEPARTMENT Provider Note   CSN: [id number] Arrival date & time: 12/24/19  5284     History Chief Complaint  Patient presents with  . Knee Pain    Caitlin Sims is a 49 y.o. female.  The history is provided by the patient. No language interpreter was used.  Knee Pain Associated symptoms: no fever      49 year old morbidly obese female with history of chronic knee pain presenting complaining of knee pain.  Patient report for the past 3 days she has been experiencing pain to her left knee.  She described pain as a sharp achy sensation, to her anterior knee and lateral knee worse with ambulation and with bending and improves with rest.  Pain is moderate in severity.  No associated fever no chest pain or trouble breathing no pain to her calves no numbness or weakness denies any pain in her ankle or her hip.  She denies any recent injury.  No specific treatment tried no prior history of DVT or PE. Denies knee popping or locking.    Past Medical History:  Diagnosis Date  . Anemia     Patient Active Problem List   Diagnosis Date Noted  . Chronic pain of right knee 09/24/2017  . Vitamin D deficiency 02/05/2017  . Other fatigue 05/30/2016  . Leukopenia 09/30/2015  . Iron deficiency anemia 08/13/2015  . S/P bariatric surgery 08/13/2015    Past Surgical History:  Procedure Laterality Date  . CESAREAN SECTION    . LAPAROSCOPIC GASTRIC BANDING  2008     OB History   No obstetric history on file.     Family History  Problem Relation Age of Onset  . Cancer Maternal Grandmother        stomach ca    Social History   Tobacco Use  . Smoking status: Former Games developer  . Smokeless tobacco: Never Used  . Tobacco comment: Vapour  Substance Use Topics  . Alcohol use: Not Currently  . Drug use: No    Home Medications Prior to Admission medications   Not on File    Allergies    Patient has no known allergies.  Review of Systems   Review  of Systems  Constitutional: Negative for fever.  Musculoskeletal: Positive for arthralgias and joint swelling.  Skin: Negative for rash and wound.    Physical Exam Updated Vital Signs BP 127/71 (BP Location: Left Arm)   Pulse 80   Temp 98.4 F (36.9 C) (Oral)   Resp 18   Ht 5\' 5"  (1.651 m)   Wt 112.5 kg   LMP 06/24/2016   SpO2 100%   BMI 41.27 kg/m   Physical Exam Vitals and nursing note reviewed.  Constitutional:      General: She is not in acute distress.    Appearance: She is well-developed and well-nourished. She is obese.  HENT:     Head: Atraumatic.  Eyes:     Conjunctiva/sclera: Conjunctivae normal.  Musculoskeletal:        General: Tenderness (Left knee: Tenderness to infrapatellar region on palpation without obvious edema erythema or warmth noted.  Mildly decreased knee flexion secondary to pain as compared to right.  Patella is located.) present.     Cervical back: Neck supple.     Comments: No tenderness to left posterior calf.  No tenderness to left ankle or hip.  Leg compartment is soft.  Skin:    Findings: No rash.  Neurological:     Mental Status:  She is alert.  Psychiatric:        Mood and Affect: Mood and affect normal.     ED Results / Procedures / Treatments   Labs (all labs ordered are listed, but only abnormal results are displayed) Labs Reviewed - No data to display  EKG None  Radiology No results found.  Procedures Procedures (including critical care time)  Medications Ordered in ED Medications  ibuprofen (ADVIL) tablet 800 mg (has no administration in time range)    ED Course  I have reviewed the triage vital signs and the nursing notes.  Pertinent labs & imaging results that were available during my care of the patient were reviewed by me and considered in my medical decision making (see chart for details).    MDM Rules/Calculators/A&P                          BP 127/71 (BP Location: Left Arm)   Pulse 80   Temp 98.4 F  (36.9 C) (Oral)   Resp 18   Ht 5\' 5"  (1.651 m)   Wt 112.5 kg   LMP 06/24/2016   SpO2 100%   BMI 41.27 kg/m   Final Clinical Impression(s) / ED Diagnoses Final diagnoses:  Acute pain of left knee    Rx / DC Orders ED Discharge Orders         Ordered    ibuprofen (ADVIL) 600 MG tablet  Every 6 hours PRN        12/24/19 1636    cyclobenzaprine (FLEXERIL) 10 MG tablet  2 times daily PRN        12/24/19 1636         4:31 PM Patient here with atraumatic left knee pain.  Pain similar to prior knee pain that she has had in the past.  No calf tenderness or posterior leg tenderness to suggest DVT.  No recent injury to the knee to suggest fracture or dislocation.  Able to ambulate.  Suspect MSK causing her pain likely due to body habitus.  Rice therapy discussed however patient made aware to return if she developed calf pain chest pain trouble breathing or develop redness or fever.  Doubt gout or septic joint doubt cellulitis.   12/26/19, PA-C 12/24/19 1637    Long, 12/26/19, MD 12/29/19 [phone]

## 2020-09-20 ENCOUNTER — Emergency Department (HOSPITAL_BASED_OUTPATIENT_CLINIC_OR_DEPARTMENT_OTHER): Payer: No Typology Code available for payment source

## 2020-09-20 ENCOUNTER — Other Ambulatory Visit: Payer: Self-pay

## 2020-09-20 ENCOUNTER — Encounter (HOSPITAL_BASED_OUTPATIENT_CLINIC_OR_DEPARTMENT_OTHER): Payer: Self-pay

## 2020-09-20 ENCOUNTER — Emergency Department (HOSPITAL_BASED_OUTPATIENT_CLINIC_OR_DEPARTMENT_OTHER)
Admission: EM | Admit: 2020-09-20 | Discharge: 2020-09-20 | Disposition: A | Discharge status: Home / Self Care | Payer: No Typology Code available for payment source | Attending: Emergency Medicine | Admitting: Emergency Medicine

## 2020-09-20 DIAGNOSIS — M25561 Pain in right knee: Secondary | ICD-10-CM | POA: Diagnosis not present

## 2020-09-20 DIAGNOSIS — W500XXA Accidental hit or strike by another person, initial encounter: Secondary | ICD-10-CM | POA: Insufficient documentation

## 2020-09-20 DIAGNOSIS — Y990 Civilian activity done for income or pay: Secondary | ICD-10-CM | POA: Insufficient documentation

## 2020-09-20 DIAGNOSIS — Z87891 Personal history of nicotine dependence: Secondary | ICD-10-CM | POA: Diagnosis not present

## 2020-09-20 MED ORDER — ACETAMINOPHEN 325 MG PO TABS
650.0000 mg | ORAL_TABLET | Freq: Once | ORAL | Status: AC
  Administered 2020-09-20: 650 mg via ORAL
  Filled 2020-09-20: qty 2

## 2020-09-20 NOTE — Discharge Instructions (Signed)
It was a pleasure taking care of you today.  As discussed, your x-ray did not show any broken bones.  Continue to ice and elevate your right leg.  You may take over-the-counter ibuprofen or Tylenol as needed for pain.  I have included the number of the orthopedic surgeon.  Please call to schedule an appointment if symptoms do not improve over the next week.  Return to the ER for new or worsening symptoms.
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
## 2021-11-01 ENCOUNTER — Emergency Department (HOSPITAL_COMMUNITY): Payer: 59

## 2021-11-01 ENCOUNTER — Encounter (HOSPITAL_COMMUNITY): Payer: Self-pay

## 2021-11-01 ENCOUNTER — Emergency Department (HOSPITAL_COMMUNITY)
Admission: EM | Admit: 2021-11-01 | Discharge: 2021-11-02 | Disposition: A | Discharge status: Home / Self Care | Payer: 59 | Attending: Student | Admitting: Student

## 2021-11-01 DIAGNOSIS — Z87891 Personal history of nicotine dependence: Secondary | ICD-10-CM | POA: Diagnosis not present

## 2021-11-01 DIAGNOSIS — R42 Dizziness and giddiness: Secondary | ICD-10-CM | POA: Diagnosis not present

## 2021-11-01 LAB — COMPREHENSIVE METABOLIC PANEL
ALT: 8 U/L (ref 0–44)
AST: 11 U/L — ABNORMAL LOW (ref 15–41)
Albumin: 3.9 g/dL (ref 3.5–5.0)
Alkaline Phosphatase: 79 U/L (ref 38–126)
Anion gap: 11 (ref 5–15)
BUN: 11 mg/dL (ref 6–20)
CO2: 25 mmol/L (ref 22–32)
Calcium: 9.2 mg/dL (ref 8.9–10.3)
Chloride: 104 mmol/L (ref 98–111)
Creatinine, Ser: 0.88 mg/dL (ref 0.44–1.00)
GFR, Estimated: >60 mL/min (ref >60)
Glucose, Bld: 83 mg/dL (ref 70–99)
Potassium: 4.1 mmol/L (ref 3.5–5.1)
Sodium: 140 mmol/L (ref 135–145)
Total Bilirubin: 0.7 mg/dL (ref 0.3–1.2)
Total Protein: 7.4 g/dL (ref 6.5–8.1)

## 2021-11-01 LAB — CBC WITH DIFFERENTIAL/PLATELET
Abs Immature Granulocytes: 0.01 10*3/uL (ref 0.00–0.07)
Basophils Absolute: 0 10*3/uL (ref 0.0–0.1)
Basophils Relative: 0 %
Eosinophils Absolute: 0.1 10*3/uL (ref 0.0–0.5)
Eosinophils Relative: 3 %
HCT: 39.7 % (ref 36.0–46.0)
Hemoglobin: 12.6 g/dL (ref 12.0–15.0)
Immature Granulocytes: 0 %
Lymphocytes Relative: 32 %
Lymphs Abs: 1.5 10*3/uL (ref 0.7–4.0)
MCH: 27.6 pg (ref 26.0–34.0)
MCHC: 31.7 g/dL (ref 30.0–36.0)
MCV: 87.1 fL (ref 80.0–100.0)
Monocytes Absolute: 0.4 10*3/uL (ref 0.1–1.0)
Monocytes Relative: 9 %
Neutro Abs: 2.6 10*3/uL (ref 1.7–7.7)
Neutrophils Relative %: 56 %
Platelets: 217 10*3/uL (ref 150–400)
RBC: 4.56 MIL/uL (ref 3.87–5.11)
RDW: 14.2 % (ref 11.5–15.5)
WBC: 4.6 10*3/uL (ref 4.0–10.5)
nRBC: 0 % (ref 0.0–0.2)

## 2021-11-01 LAB — I-STAT BETA HCG BLOOD, ED (MC, WL, AP ONLY): I-stat hCG, quantitative: <5 m[IU]/mL (ref <5)

## 2021-11-01 NOTE — ED Provider Triage Note (Signed)
Emergency Medicine Provider Triage Evaluation Note  Caitlin Sims , a 51 y.o. female  was evaluated in triage.  Pt complains of episodes of dizziness.  Started on Friday last week when she laid back.  Lasted 1 to 2 hours of feeling like the room was spinning.  Continues to happen when she lays back, and sometimes when she is already laying back.  Eventually resolves, denies dizziness at this time.  Sometimes with nausea and usually with blurry vision.  Denies fever, recent upper respiratory infection, or history of anything like this prior.    Review of Systems  Positive: See Negative: Above  Physical Exam  BP (!) 143/67 (BP Location: Right Arm)   Pulse 82   Temp 98.7 F (37.1 C)   Resp 16   Ht 5\' 5"  (1.651 m)   Wt 107.5 kg   LMP 06/24/2016   SpO2 100%   BMI 39.44 kg/m  Gen:   Awake, no distress   Resp:  Normal effort  MSK:   Moves extremities without difficulty  Other:  Gait Appropriately.  Negative BEFAST.  PERRLA.  Normal EOMs.  Sensation, coordination, ROM appears grossly intact.  No truncal deviation.CN V and VII appears grossly intact.  Medical Decision Making  Medically screening exam initiated at 1:45 PM.  Appropriate orders placed.  Athalene Kolle was informed that the remainder of the evaluation will be completed by another provider, this initial triage assessment does not replace that evaluation, and the importance of remaining in the ED until their evaluation is complete.     Prince Rome, PA-C 96/28/36 1349

## 2021-11-01 NOTE — ED Triage Notes (Signed)
Pt states that she has had dizziness since Friday. Pt states when she lays down the room spins.

## 2021-11-02 MED ORDER — MECLIZINE HCL 25 MG PO TABS: 25.0000 mg | ORAL_TABLET | Freq: Three times a day (TID) | ORAL | 0 refills | Status: AC | PRN

## 2021-11-02 MED ORDER — DIPHENHYDRAMINE HCL 50 MG/ML IJ SOLN
25.0000 mg | Freq: Once | INTRAMUSCULAR | Status: AC
  Administered 2021-11-02: 25 mg via INTRAVENOUS
  Filled 2021-11-02: qty 1

## 2021-11-02 MED ORDER — LACTATED RINGERS IV BOLUS
1000.0000 mL | Freq: Once | INTRAVENOUS | Status: AC
  Administered 2021-11-02: 1000 mL via INTRAVENOUS

## 2021-11-02 MED ORDER — MECLIZINE HCL 25 MG PO TABS: 25.0000 mg | ORAL_TABLET | Freq: Three times a day (TID) | ORAL | 0 refills | Status: DC | PRN

## 2021-11-02 MED ORDER — PROCHLORPERAZINE EDISYLATE 10 MG/2ML IJ SOLN
10.0000 mg | Freq: Once | INTRAMUSCULAR | Status: AC
  Administered 2021-11-02: 10 mg via INTRAVENOUS
  Filled 2021-11-02: qty 2

## 2021-11-02 NOTE — ED Provider Notes (Signed)
[facility] DEPARTMENT Provider Note  CSN: [id number] Arrival date & time: 11/01/21 1240  Chief Complaint(s) Dizziness  HPI Caitlin Sims is a 51 y.o. female with PMH anemia, bariatric surgery who presents emergency department for evaluation of dizziness and vertigo.  Patient states that for the last 5 days she has had persistent vertigo.  She states that it is worse when lying flat and will ultimately be fatigable but any movement will cause triggering and recurrence of her vertigo.  Denies alcohol use.  Denies chest pain, shortness of breath, abdominal pain, nausea, vomiting, numbness, tingling, weakness or other systemic or neurologic complaints.    Past Medical History Past Medical History:  Diagnosis Date   Anemia    Patient Active Problem List   Diagnosis Date Noted   Chronic pain of right knee 09/24/2017   Vitamin D deficiency 02/05/2017   Other fatigue 05/30/2016   Leukopenia 09/30/2015   Iron deficiency anemia 08/13/2015   S/P bariatric surgery 08/13/2015   Home Medication(s) Prior to Admission medications   Medication Sig Start Date End Date Taking? Authorizing Provider  cyclobenzaprine (FLEXERIL) 10 MG tablet Take 1 tablet (10 mg total) by mouth 2 (two) times daily as needed for muscle spasms. 12/24/19   Domenic Moras, PA-C  ibuprofen (ADVIL) 600 MG tablet Take 1 tablet (600 mg total) by mouth every 6 (six) hours as needed. 12/24/19   Domenic Moras, PA-C                                                                                                                                    Past Surgical History Past Surgical History:  Procedure Laterality Date   CESAREAN SECTION     LAPAROSCOPIC GASTRIC BANDING  2008   Family History Family History  Problem Relation Age of Onset   Cancer Maternal Grandmother        stomach ca    Social History Social History   Tobacco Use   Smoking status: Former   Smokeless tobacco: Never   Tobacco  comments:    Vapour  Substance Use Topics   Alcohol use: Not Currently   Drug use: No   Allergies Patient has no known allergies.  Review of Systems Review of Systems  Neurological:  Positive for dizziness.    Physical Exam Vital Signs  I have reviewed the triage vital signs BP (!) 135/58 (BP Location: Right Arm)   Pulse 64   Temp 97.9 F (36.6 C)   Resp 14   Ht 5\' 5"  (1.651 m)   Wt 107.5 kg   LMP 06/24/2016   SpO2 98%   BMI 39.44 kg/m   Physical Exam Vitals and nursing note reviewed.  Constitutional:      General: She is not in acute distress.    Appearance: She is well-developed.  HENT:     Head: Normocephalic and atraumatic.  Eyes:     Conjunctiva/sclera:  Conjunctivae normal.  Cardiovascular:     Rate and Rhythm: Normal rate and regular rhythm.     Heart sounds: No murmur heard. Pulmonary:     Effort: Pulmonary effort is normal. No respiratory distress.     Breath sounds: Normal breath sounds.  Abdominal:     Palpations: Abdomen is soft.     Tenderness: There is no abdominal tenderness.  Musculoskeletal:        General: No swelling.     Cervical back: Neck supple.  Skin:    General: Skin is warm and dry.     Capillary Refill: Capillary refill takes less than 2 seconds.  Neurological:     Mental Status: She is alert.     Cranial Nerves: No cranial nerve deficit.     Sensory: No sensory deficit.     Motor: No weakness.  Psychiatric:        Mood and Affect: Mood normal.     ED Results and Treatments Labs (all labs ordered are listed, but only abnormal results are displayed) Labs Reviewed  COMPREHENSIVE METABOLIC PANEL - Abnormal; Notable for the following components:      Result Value   AST 11 (*)    All other components within normal limits  CBC WITH DIFFERENTIAL/PLATELET  URINALYSIS, ROUTINE W REFLEX MICROSCOPIC  I-STAT BETA HCG BLOOD, ED ([facility], [facility], [facility] ONLY)  CBG MONITORING, ED                                                                                                                           Radiology CT Head Wo Contrast  Result Date: 11/01/2021 CLINICAL DATA:  Dizziness, persistent/recurrent, cardiac or vascular cause suspected EXAM: CT HEAD WITHOUT CONTRAST TECHNIQUE: Contiguous axial images were obtained from the base of the skull through the vertex without intravenous contrast. RADIATION DOSE REDUCTION: This exam was performed according to the departmental dose-optimization program which includes automated exposure control, adjustment of the mA and/or kV according to patient size and/or use of iterative reconstruction technique. COMPARISON:  None Available. FINDINGS: Brain: No evidence of acute infarction, hemorrhage, hydrocephalus, extra-axial collection or mass lesion/mass effect. Vascular: No hyperdense vessel identified. Skull: No acute fracture. Sinuses/Orbits: Clear sinuses.  No acute orbital findings. Other: No mastoid effusions. IMPRESSION: No evidence of acute intracranial abnormality. Electronically Signed   By: Feliberto Harts M.D.   On: 11/01/2021 14:24    Pertinent labs & imaging results that were available during my care of the patient were reviewed by me and considered in my medical decision making (see MDM for details).  Medications Ordered in ED Medications  prochlorperazine (COMPAZINE) injection 10 mg (10 mg Intravenous Given 11/02/21 0201)  diphenhydrAMINE (BENADRYL) injection 25 mg (25 mg Intravenous Given 11/02/21 0159)  lactated ringers bolus 1,000 mL (1,000 mLs Intravenous New Bag/Given 11/02/21 0203)  Procedures Procedures  (including critical care time)  Medical Decision Making / ED Course   This patient presents to the ED for concern of vertigo, this involves an extensive number of treatment options, and is a complaint that carries with it a high risk of complications  and morbidity.  The differential diagnosis includes BPPV, labyrinthitis, vestibular neuritis, CVA, dehydration, anemia  MDM: Patient seen the emergency room for evaluation of vertigo.  Physical exam largely unremarkable with no focal motor or sensory deficits, no cranial nerve deficits.  Laboratory evaluation unremarkable.  CT head unremarkable.  Patient received a headache cocktail with Compazine and Benadryl and fluid resuscitation and on reevaluation her symptoms have completely resolved.  Suspect peripheral vertigo at this time and patient will need to follow-up outpatient with her PCP and she was discharged with a prescription for meclizine.  I have overall low suspicion for central stroke given resolution of symptoms today and fatigable symptoms on arrival.   Additional history obtained:  -External records from outside source obtained and reviewed including: Chart review including previous notes, labs, imaging, consultation notes   Lab Tests: -I ordered, reviewed, and interpreted labs.   The pertinent results include:   Labs Reviewed  COMPREHENSIVE METABOLIC PANEL - Abnormal; Notable for the following components:      Result Value   AST 11 (*)    All other components within normal limits  CBC WITH DIFFERENTIAL/PLATELET  URINALYSIS, ROUTINE W REFLEX MICROSCOPIC  I-STAT BETA HCG BLOOD, ED ([facility], [facility], [facility] ONLY)  CBG MONITORING, ED      EKG   EKG Interpretation  Date/Time:  Tuesday November 01 2021 13:07:52 EDT Ventricular Rate:  77 PR Interval:  154 QRS Duration: 72 QT Interval:  396 QTC Calculation: 448 R Axis:   76 Text Interpretation: Normal sinus rhythm Normal ECG When compared with ECG of 29-Mar-2019 12:37, PREVIOUS ECG IS PRESENT Confirmed by Atha Mcbain ([id number]) on 11/02/2021 3:31:56 AM         Imaging Studies ordered: I ordered imaging studies including CT head I independently visualized and interpreted imaging. I agree with the radiologist  interpretation   Medicines ordered and prescription drug management: Meds ordered this encounter  Medications   prochlorperazine (COMPAZINE) injection 10 mg   diphenhydrAMINE (BENADRYL) injection 25 mg   lactated ringers bolus 1,000 mL    -I have reviewed the patients home medicines and have made adjustments as needed  Critical interventions none  Cardiac Monitoring: The patient was maintained on a cardiac monitor.  I personally viewed and interpreted the cardiac monitored which showed an underlying rhythm of: NSR  Social Determinants of Health:  Factors impacting patients care include: none   Reevaluation: After the interventions noted above, I reevaluated the patient and found that they have :improved  Co morbidities that complicate the patient evaluation  Past Medical History:  Diagnosis Date   Anemia       Dispostion: I considered admission for this patient, but with symptoms improved, patient does not meet inpatient criteria for admission and she is safe for discharge with outpatient follow-up     Final Clinical Impression(s) / ED Diagnoses Final diagnoses:  None     @PCDICTATION @    , MD 11/02/21 [phone]
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
## 2022-11-08 ENCOUNTER — Emergency Department (HOSPITAL_COMMUNITY): Payer: 59

## 2022-11-08 ENCOUNTER — Emergency Department (HOSPITAL_COMMUNITY)
Admission: EM | Admit: 2022-11-08 | Discharge: 2022-11-09 | Disposition: A | Discharge status: Home / Self Care | Payer: 59 | Attending: Emergency Medicine | Admitting: Emergency Medicine

## 2022-11-08 DIAGNOSIS — R55 Syncope and collapse: Secondary | ICD-10-CM | POA: Diagnosis not present

## 2022-11-08 DIAGNOSIS — R030 Elevated blood-pressure reading, without diagnosis of hypertension: Secondary | ICD-10-CM | POA: Insufficient documentation

## 2022-11-08 DIAGNOSIS — R42 Dizziness and giddiness: Secondary | ICD-10-CM | POA: Diagnosis present

## 2022-11-08 LAB — CBC
HCT: 38.3 % (ref 36.0–46.0)
Hemoglobin: 11.9 g/dL — ABNORMAL LOW (ref 12.0–15.0)
MCH: 26.3 pg (ref 26.0–34.0)
MCHC: 31.1 g/dL (ref 30.0–36.0)
MCV: 84.5 fL (ref 80.0–100.0)
Platelets: 201 10*3/uL (ref 150–400)
RBC: 4.53 MIL/uL (ref 3.87–5.11)
RDW: 13.3 % (ref 11.5–15.5)
WBC: 3.6 10*3/uL — ABNORMAL LOW (ref 4.0–10.5)
nRBC: 0 % (ref 0.0–0.2)

## 2022-11-08 LAB — BASIC METABOLIC PANEL
Anion gap: 10 (ref 5–15)
BUN: 8 mg/dL (ref 6–20)
CO2: 24 mmol/L (ref 22–32)
Calcium: 9.1 mg/dL (ref 8.9–10.3)
Chloride: 106 mmol/L (ref 98–111)
Creatinine, Ser: 0.72 mg/dL (ref 0.44–1.00)
GFR, Estimated: >60 mL/min (ref >60)
Glucose, Bld: 98 mg/dL (ref 70–99)
Potassium: 3.5 mmol/L (ref 3.5–5.1)
Sodium: 140 mmol/L (ref 135–145)

## 2022-11-08 LAB — TROPONIN I (HIGH SENSITIVITY)
Troponin I (High Sensitivity): 3 ng/L (ref <18)
Troponin I (High Sensitivity): 4 ng/L (ref <18)

## 2022-11-08 LAB — HCG, SERUM, QUALITATIVE: Preg, Serum: NEGATIVE

## 2022-11-08 LAB — CBG MONITORING, ED: Glucose-Capillary: 105 mg/dL — ABNORMAL HIGH (ref 70–99)

## 2022-11-08 NOTE — ED Triage Notes (Addendum)
Pt to ED via [facility] from parking lot. Pt works for [employer] and was sitting in car before work when she began feeling light headed, dizzy, and diaphoretic. Pt has hx of vertigo but states this feels different. Pt ambulatory on scene with EMS but did c/o feeling dizzy while ambulating. Pt denies SOB, CP, N/V/D, no blurred vision, no headache. Pt denies any medical hx. Pt reports not drinking any water today and only eating a small breakfast.    EMS: 20 RAC 174/96 80 HR 99% RA 103 CBG

## 2022-11-08 NOTE — ED Provider Triage Note (Signed)
Emergency Medicine Provider Triage Evaluation Note  Caitlin Sims , a 52 y.o. female  was evaluated in triage.  Pt complains of episode of diaphoresis, lightheadedness, feeling like she was going to faint.  History of vertigo in the past, no previous head injury, no headache or neuro symptom.  Still feels "off" and not back to baseline, unsteady gait.  Review of Systems  Positive: Diaphoresis, lightheadedness, unsteady gait Negative: Headache, acute neurodeficit, chest pain  Physical Exam  BP (!) 161/100 (BP Location: Left Arm)   Pulse 70   Temp 98.2 F (36.8 C)   Resp 16   LMP 06/24/2016   SpO2 100%  Gen:   Awake, no distress   Resp:  Normal effort  MSK:   Moves extremities without difficulty  Other:  Equal pupils and strength and 4/4 extremity  Medical Decision Making  Medically screening exam initiated at 8:31 PM.  Appropriate orders placed.  Caitlin Sims was informed that the remainder of the evaluation will be completed by another provider, this initial triage assessment does not replace that evaluation, and the importance of remaining in the ED until their evaluation is complete.  Patient was counseled that they need to remain in the ED until the completion of their work-up including a full H&P and results of any tests.  It was explained to them that the risk of leaving the emergency department prior to completion of this treatment could be very dangerous to their health.  The patient appears stable and the remainder of the encounter may be completed by another provider.   Caitlin Logan, DO 11/08/22 2039

## 2022-11-08 NOTE — ED Notes (Signed)
Attempted to collect urine sample from pt. Pt states she is unable to provide sample at this time.

## 2022-11-09 NOTE — ED Provider Notes (Signed)
Caitlin Sims EMERGENCY DEPARTMENT AT [facility] Provider Note   CSN: [id number] Arrival date & time: 11/08/22  1942     History  Chief Complaint  Patient presents with   Dizziness    Caitlin Sims is a 52 y.o. female.  The history is provided by the patient.  Dizziness Caitlin Sims is a 52 y.o. female who presents to the Emergency Department complaining of dizziness.  She presents to the emergency department for evaluation of dizziness.  At 645 she was going to work and she became diaphoretic.  She initially thought it was a hot flash but she continued to sweat the entire trip to work and when she arrived there she felt sweaty and out of it.  She fell like she was going to pass out.  The friend said that she looked like she was breathing heavy but she does not recall shortness of breath.  Overall she is feeling better at time of ED assessment.  No associated headache, chest pain, current difficulty breathing, nausea, vomiting, leg swelling or pain.  She has no known medical problems.  No routine medications.  She does get regular evaluations.  No history of DVT/PE.  She has been under increased stress recently.    Home Medications Prior to Admission medications   Medication Sig Start Date End Date Taking? Authorizing Provider  cyclobenzaprine (FLEXERIL) 10 MG tablet Take 1 tablet (10 mg total) by mouth 2 (two) times daily as needed for muscle spasms. 12/24/19   Fayrene Helper, PA-C  ibuprofen (ADVIL) 600 MG tablet Take 1 tablet (600 mg total) by mouth every 6 (six) hours as needed. 12/24/19   Fayrene Helper, PA-C  meclizine (ANTIVERT) 25 MG tablet Take 1 tablet (25 mg total) by mouth 3 (three) times daily as needed for dizziness. 11/02/21   Kommor, Wyn Forster, MD      Allergies    Patient has no known allergies.    Review of Systems   Review of Systems  Neurological:  Positive for dizziness.  All other systems reviewed and are negative.   Physical Exam Updated Vital  Signs BP (!) 162/88   Pulse 68   Temp (!) 97.4 F (36.3 C) (Oral)   Resp 18   LMP 06/24/2016   SpO2 100%  Physical Exam Vitals and nursing note reviewed.  Constitutional:      Appearance: She is well-developed.  HENT:     Head: Normocephalic and atraumatic.  Cardiovascular:     Rate and Rhythm: Normal rate and regular rhythm.     Heart sounds: No murmur heard. Pulmonary:     Effort: Pulmonary effort is normal. No respiratory distress.     Breath sounds: Normal breath sounds.  Abdominal:     Palpations: Abdomen is soft.     Tenderness: There is no abdominal tenderness. There is no guarding or rebound.  Musculoskeletal:        General: No swelling or tenderness.     Comments: 2+ DP pulses bilaterally  Skin:    General: Skin is warm and dry.  Neurological:     Mental Status: She is alert and oriented to person, place, and time.     Comments: MAE symmetrically  Psychiatric:        Behavior: Behavior normal.     ED Results / Procedures / Treatments   Labs (all labs ordered are listed, but only abnormal results are displayed) Labs Reviewed  CBC - Abnormal; Notable for the following components:  Result Value   WBC 3.6 (*)    Hemoglobin 11.9 (*)    All other components within normal limits  CBG MONITORING, ED - Abnormal; Notable for the following components:   Glucose-Capillary 105 (*)    All other components within normal limits  BASIC METABOLIC PANEL  HCG, SERUM, QUALITATIVE  URINALYSIS, ROUTINE W REFLEX MICROSCOPIC  PREGNANCY, URINE  TROPONIN I (HIGH SENSITIVITY)  TROPONIN I (HIGH SENSITIVITY)    EKG EKG Interpretation Date/Time:  Wednesday November 08 2022 19:53:50 EST Ventricular Rate:  68 PR Interval:  162 QRS Duration:  74 QT Interval:  408 QTC Calculation: 433 R Axis:   61  Text Interpretation: Normal sinus rhythm Normal ECG Confirmed by Tilden Fossa [phone]) on 11/08/2022 11:40:08 PM  Radiology DG Chest 1 View  Result Date:  11/08/2022 CLINICAL DATA:  Shortness of breath EXAM: CHEST  1 VIEW COMPARISON:  03/29/2019 FINDINGS: Heart and mediastinal contours are within normal limits. No focal opacities or effusions. No acute bony abnormality. IMPRESSION: No active disease. Electronically Signed   By: Charlett Nose M.D.   On: 11/08/2022 21:30   CT Head Wo Contrast  Result Date: 11/08/2022 CLINICAL DATA:  Syncope EXAM: CT HEAD WITHOUT CONTRAST TECHNIQUE: Contiguous axial images were obtained from the base of the skull through the vertex without intravenous contrast. RADIATION DOSE REDUCTION: This exam was performed according to the departmental dose-optimization program which includes automated exposure control, adjustment of the mA and/or kV according to patient size and/or use of iterative reconstruction technique. COMPARISON:  11/01/2021 FINDINGS: Brain: No mass,hemorrhage or extra-axial collection. Normal appearance of the parenchyma and CSF spaces. Vascular: No hyperdense vessel or unexpected vascular calcification. Skull: The visualized skull base, calvarium and extracranial soft tissues are normal. Sinuses/Orbits: No fluid levels or advanced mucosal thickening of the visualized paranasal sinuses. No mastoid or middle ear effusion. Normal orbits. IMPRESSION: Normal head CT. Electronically Signed   By: Deatra Robinson M.D.   On: 11/08/2022 21:05    Procedures Procedures    Medications Ordered in ED Medications - No data to display  ED Course/ Medical Decision Making/ A&P                                 Medical Decision Making Amount and/or Complexity of Data Reviewed Labs: ordered.   Patient here for evaluation following near syncopal episode with associated diaphoresis.  Symptoms resolved shortly after ED arrival.  Patient without focal deficits.  Troponins are negative x 2 and EKG without acute ischemia or arrhythmia.  No significant anemia or electrolyte abnormality.  Current clinical picture is not consistent with  subarachnoid hemorrhage, arrhythmia, PE.  Patient's blood pressures are mildly elevated in the emergency department, no history of same.  Given she is asymptomatic with these elevated blood pressures feel she is stable for discharge home with outpatient follow-up and return precautions.  She is under significant stress.        Final Clinical Impression(s) / ED Diagnoses Final diagnoses:  Near syncope  Elevated blood pressure reading in office without diagnosis of hypertension    Rx / DC Orders ED Discharge Orders     None         Tilden Fossa, MD 11/09/22 0401

## 2022-11-28 ENCOUNTER — Other Ambulatory Visit: Payer: Self-pay

## 2022-11-28 ENCOUNTER — Encounter (HOSPITAL_BASED_OUTPATIENT_CLINIC_OR_DEPARTMENT_OTHER): Payer: Self-pay | Admitting: Emergency Medicine

## 2022-11-28 ENCOUNTER — Emergency Department (HOSPITAL_BASED_OUTPATIENT_CLINIC_OR_DEPARTMENT_OTHER)
Admission: EM | Admit: 2022-11-28 | Discharge: 2022-11-28 | Disposition: A | Discharge status: Home / Self Care | Payer: 59 | Attending: Emergency Medicine | Admitting: Emergency Medicine

## 2022-11-28 DIAGNOSIS — J00 Acute nasopharyngitis [common cold]: Secondary | ICD-10-CM | POA: Diagnosis not present

## 2022-11-28 DIAGNOSIS — R1032 Left lower quadrant pain: Secondary | ICD-10-CM | POA: Insufficient documentation

## 2022-11-28 DIAGNOSIS — M545 Low back pain, unspecified: Secondary | ICD-10-CM | POA: Diagnosis not present

## 2022-11-28 DIAGNOSIS — J029 Acute pharyngitis, unspecified: Secondary | ICD-10-CM | POA: Diagnosis present

## 2022-11-28 NOTE — ED Triage Notes (Signed)
Pt c/o sore throat that started last night prior to going to work, states that after she went to work her lower back and abdomen started to hurt as well.

## 2022-11-28 NOTE — Discharge Instructions (Signed)
You may take over-the-counter medicine for symptomatic relief, such as Tylenol, Motrin, TheraFlu, Alka seltzer , black elderberry, etc. Please limit acetaminophen (Tylenol) to 4000 mg and Ibuprofen (Motrin, Advil, etc.) to 2400 mg for a 24hr period. Please note that other over-the-counter medicine may contain acetaminophen or ibuprofen as a component of their ingredients.

## 2022-11-28 NOTE — ED Provider Notes (Signed)
[place] EMERGENCY DEPARTMENT AT [facility] Provider Note  CSN: [id number] Arrival date & time: 11/28/22 0355  Chief Complaint(s) Sore Throat  HPI Caitlin Sims is a 52 y.o. female    The history is provided by the patient.  Sore Throat This is a new problem. The current episode started 6 to 12 hours ago. The problem occurs constantly. The problem has not changed since onset.Pertinent negatives include no chest pain, no headaches and no shortness of breath. Nothing aggravates the symptoms. Nothing relieves the symptoms. She has tried nothing for the symptoms.   Patient reports that while at work she developed lower back pain which is an intermittent thing for her.  She denies any falls or trauma.  She also reported that she had an episode of left lower quadrant pain that was brief in nature and now resolved.   Past Medical History Past Medical History:  Diagnosis Date   Anemia    Patient Active Problem List   Diagnosis Date Noted   Chronic pain of right knee 09/24/2017   Vitamin D deficiency 02/05/2017   Other fatigue 05/30/2016   Leukopenia 09/30/2015   Iron deficiency anemia 08/13/2015   S/P bariatric surgery 08/13/2015   Home Medication(s) Prior to Admission medications   Medication Sig Start Date End Date Taking? Authorizing Provider  cyclobenzaprine (FLEXERIL) 10 MG tablet Take 1 tablet (10 mg total) by mouth 2 (two) times daily as needed for muscle spasms. 12/24/19   Fayrene Helper, PA-C  ibuprofen (ADVIL) 600 MG tablet Take 1 tablet (600 mg total) by mouth every 6 (six) hours as needed. 12/24/19   Fayrene Helper, PA-C  meclizine (ANTIVERT) 25 MG tablet Take 1 tablet (25 mg total) by mouth 3 (three) times daily as needed for dizziness. 11/02/21   Kommor, Wyn Forster, MD                                                                                                                                    Allergies Patient has no known allergies.  Review of  Systems Review of Systems  Respiratory:  Negative for shortness of breath.   Cardiovascular:  Negative for chest pain.  Neurological:  Negative for headaches.   As noted in HPI  Physical Exam Vital Signs  I have reviewed the triage vital signs BP (!) 143/86   Pulse 75   Temp 98.5 F (36.9 C)   Resp 18   Ht 5\' 5"  (1.651 m)   Wt 107.5 kg   LMP 06/24/2016   SpO2 100%   BMI 39.44 kg/m   Physical Exam Vitals reviewed.  Constitutional:      General: She is not in acute distress.    Appearance: She is well-developed. She is not diaphoretic.  HENT:     Head: Normocephalic and atraumatic.     Nose: Mucosal edema and congestion present.     Mouth/Throat:     Pharynx:  Postnasal drip present. No pharyngeal swelling or posterior oropharyngeal erythema.     Tonsils: No tonsillar exudate or tonsillar abscesses.  Eyes:     General: No scleral icterus.       Right eye: No discharge.        Left eye: No discharge.     Conjunctiva/sclera: Conjunctivae normal.     Pupils: Pupils are equal, round, and reactive to light.  Cardiovascular:     Rate and Rhythm: Normal rate and regular rhythm.     Heart sounds: No murmur heard.    No friction rub. No gallop.  Pulmonary:     Effort: Pulmonary effort is normal. No respiratory distress.     Breath sounds: Normal breath sounds. No stridor. No rales.  Abdominal:     General: There is no distension.     Palpations: Abdomen is soft.     Tenderness: There is no abdominal tenderness.  Musculoskeletal:        General: No tenderness.     Cervical back: Normal range of motion and neck supple.  Skin:    General: Skin is warm and dry.     Findings: No erythema or rash.  Neurological:     Mental Status: She is alert and oriented to person, place, and time.     ED Results and Treatments Labs (all labs ordered are listed, but only abnormal results are displayed) Labs Reviewed - No data to display                                                                                                                        EKG  EKG Interpretation Date/Time:    Ventricular Rate:    PR Interval:    QRS Duration:    QT Interval:    QTC Calculation:   R Axis:      Text Interpretation:         Radiology No results found.  Medications Ordered in ED Medications - No data to display Procedures Procedures  (including critical care time) Medical Decision Making / ED Course   Medical Decision Making   Patient presents with viral symptoms for 1 day. Adequate oral hydration. Rest of history as above.  Patient appears well. No signs of toxicity, patient is interactive. No hypoxia, tachypnea or other signs of respiratory distress. No sign of clinical dehydration. Lung exam clear. Rest of exam as above.  Most consistent with viral illness   No evidence suggestive of pharyngitis, AOM, PNA.  Chest x-ray not indicated at this time.  Discussed symptomatic treatment with the patient and they will follow closely with their PCP.      Final Clinical Impression(s) / ED Diagnoses Final diagnoses:  Acute nasopharyngitis   The patient appears reasonably screened and/or stabilized for discharge and I doubt any other medical condition or other [facility] requiring further screening, evaluation, or treatment in the ED at this time. I have discussed the findings, Dx and Tx plan with the patient/family  who expressed understanding and agree(s) with the plan. Discharge instructions discussed at length. The patient/family was given strict return precautions who verbalized understanding of the instructions. No further questions at time of discharge.  Disposition: Discharge  Condition: Good  ED Discharge Orders     None        Follow Up: Lenox Ponds, MD [address] [place] [address] [phone]  Call  to schedule an appointment for close follow up    This chart was dictated using voice recognition software.   Despite best efforts to proofread,  errors can occur which can change the documentation meaning.    Nira Conn, MD 11/28/22 [phone]

## 2023-08-24 ENCOUNTER — Emergency Department (HOSPITAL_COMMUNITY)

## 2023-08-24 ENCOUNTER — Other Ambulatory Visit: Payer: Self-pay

## 2023-08-24 ENCOUNTER — Emergency Department (HOSPITAL_COMMUNITY)
Admission: EM | Admit: 2023-08-24 | Discharge: 2023-08-24 | Disposition: A | Discharge status: Home / Self Care | Attending: Emergency Medicine | Admitting: Emergency Medicine

## 2023-08-24 DIAGNOSIS — R1084 Generalized abdominal pain: Secondary | ICD-10-CM | POA: Diagnosis present

## 2023-08-24 DIAGNOSIS — K529 Noninfective gastroenteritis and colitis, unspecified: Secondary | ICD-10-CM | POA: Insufficient documentation

## 2023-08-24 DIAGNOSIS — E876 Hypokalemia: Secondary | ICD-10-CM | POA: Diagnosis not present

## 2023-08-24 DIAGNOSIS — Z87891 Personal history of nicotine dependence: Secondary | ICD-10-CM | POA: Diagnosis not present

## 2023-08-24 LAB — CBC
HCT: 40.2 % (ref 36.0–46.0)
Hemoglobin: 12.5 g/dL (ref 12.0–15.0)
MCH: 27.4 pg (ref 26.0–34.0)
MCHC: 31.1 g/dL (ref 30.0–36.0)
MCV: 88.2 fL (ref 80.0–100.0)
Platelets: 200 K/uL (ref 150–400)
RBC: 4.56 MIL/uL (ref 3.87–5.11)
RDW: 14.2 % (ref 11.5–15.5)
WBC: 4.3 K/uL (ref 4.0–10.5)
nRBC: 0 % (ref 0.0–0.2)

## 2023-08-24 LAB — COMPREHENSIVE METABOLIC PANEL WITH GFR
ALT: 12 U/L (ref 0–44)
AST: 15 U/L (ref 15–41)
Albumin: 3.6 g/dL (ref 3.5–5.0)
Alkaline Phosphatase: 75 U/L (ref 38–126)
Anion gap: 7 (ref 5–15)
BUN: 7 mg/dL (ref 6–20)
CO2: 25 mmol/L (ref 22–32)
Calcium: 8.7 mg/dL — ABNORMAL LOW (ref 8.9–10.3)
Chloride: 107 mmol/L (ref 98–111)
Creatinine, Ser: 0.76 mg/dL (ref 0.44–1.00)
GFR, Estimated: >60 mL/min (ref >60)
Glucose, Bld: 99 mg/dL (ref 70–99)
Potassium: 3.4 mmol/L — ABNORMAL LOW (ref 3.5–5.1)
Sodium: 139 mmol/L (ref 135–145)
Total Bilirubin: 0.7 mg/dL (ref 0.0–1.2)
Total Protein: 7.5 g/dL (ref 6.5–8.1)

## 2023-08-24 LAB — LIPASE, BLOOD: Lipase: 26 U/L (ref 11–51)

## 2023-08-24 MED ORDER — IOHEXOL 300 MG/ML  SOLN
100.0000 mL | Freq: Once | INTRAMUSCULAR | Status: AC | PRN
  Administered 2023-08-24: 100 mL via INTRAVENOUS

## 2023-08-24 MED ORDER — AMOXICILLIN-POT CLAVULANATE 875-125 MG PO TABS
1.0000 | ORAL_TABLET | Freq: Once | ORAL | Status: AC
  Administered 2023-08-24: 1 via ORAL
  Filled 2023-08-24: qty 1

## 2023-08-24 MED ORDER — ACETAMINOPHEN 500 MG PO TABS
1000.0000 mg | ORAL_TABLET | Freq: Once | ORAL | Status: AC
  Administered 2023-08-24: 1000 mg via ORAL
  Filled 2023-08-24: qty 2

## 2023-08-24 MED ORDER — OXYCODONE HCL 5 MG PO TABS: 5.0000 mg | ORAL_TABLET | Freq: Four times a day (QID) | ORAL | 0 refills | Status: AC | PRN

## 2023-08-24 MED ORDER — OXYCODONE HCL 5 MG PO TABS
5.0000 mg | ORAL_TABLET | Freq: Once | ORAL | Status: AC
  Administered 2023-08-24: 5 mg via ORAL
  Filled 2023-08-24: qty 1

## 2023-08-24 MED ORDER — POTASSIUM CHLORIDE 20 MEQ PO PACK: 40.0000 meq | PACK | Freq: Once | ORAL | Status: DC

## 2023-08-24 MED ORDER — AMOXICILLIN-POT CLAVULANATE 875-125 MG PO TABS: 1.0000 | ORAL_TABLET | Freq: Two times a day (BID) | ORAL | 0 refills | Status: AC

## 2023-08-24 NOTE — Discharge Instructions (Addendum)
 We evaluated you for your diarrhea and abdominal pain.  Your CT scan shows a condition called colitis, which is inflammation in your colon.  It is possible that you developed a colon infection while you are traveling internationally.  We have prescribed you an antibiotic.  Please take this as prescribed.  Please follow-up closely with your primary doctor.  Please take 1000 mg of Tylenol  every 6 hours as needed for pain.  Please be sure you are staying hydrated and drinking lots of fluids.  We have given you a small amount of oxycodone  which you can take for pain not relieved by Tylenol .  Do not drink alcohol, drive, or operate machinery when taking this medicine.  If you have any new or worsening symptoms such as severe worsening pain, vomiting, fevers, bloody stool, or any other new symptoms, please return to the emergency department for recheck.

## 2023-08-24 NOTE — ED Notes (Signed)
 Re-eval of Pt performed while waiting in lobby. Pt's Vitals updated. Pt stated pain has increased in her abdomen since first arrival to the ED. Pt denies any nausea or emesis

## 2023-08-24 NOTE — ED Provider Triage Note (Signed)
 Emergency Medicine Provider Triage Evaluation Note  Caitlin Sims , a 53 y.o. female  was evaluated in triage.  Pt complains of abdominal pain that started today.  She had onset of diarrhea last night.  Describes her pain as waxing and waning but severe when it comes on.  Denies any emesis.  Review of Systems  Positive: As above Negative: As above  Physical Exam  BP (!) 116/90 (BP Location: Right Arm)   Pulse 91   Temp 99.1 F (37.3 C) (Oral)   Resp 18   Ht 5' 5 (1.651 m)   Wt 117.9 kg   LMP 06/24/2016   SpO2 98%   BMI 43.27 kg/m  Gen:   Awake, no distress   Resp:  Normal effort  MSK:   Moves extremities without difficulty Other:    Medical Decision Making  Medically screening exam initiated at 3:36 PM.  Appropriate orders placed.  Caitlin Sims was informed that the remainder of the evaluation will be completed by another provider, this initial triage assessment does not replace that evaluation, and the importance of remaining in the ED until their evaluation is complete.     Hildegard Loge, PA-C 08/24/23 1537

## 2023-08-24 NOTE — ED Provider Notes (Signed)
 [place] EMERGENCY DEPARTMENT AT [facility] Provider Note  CSN: [id number] Arrival date & time: 08/24/23 1504  Chief Complaint(s) Abdominal Pain  HPI Caitlin Sims is a 53 y.o. female history of prior bariatric surgery presenting to the emergency department diarrhea.  Patient reports associated abdominal pain.  Symptoms been present for the past few days since yesterday.  Abdominal pain comes and goes, described as cramping.  Reports that she has a small amount of blood once or twice when she was wiping but otherwise no blood mixed with stool.  She denies any nausea or vomiting.  No fevers or chills.  She did travel recently internationally and just came back from the [place], had some diarrhea when she was there.  Diarrhea improved but then recurred last night.  No recent antibiotic use or hospitalization.  She has not taken anything for pain at home.   Past Medical History Past Medical History:  Diagnosis Date   Anemia    Patient Active Problem List   Diagnosis Date Noted   Chronic pain of right knee 09/24/2017   Vitamin D  deficiency 02/05/2017   Other fatigue 05/30/2016   Leukopenia 09/30/2015   Iron deficiency anemia 08/13/2015   S/P bariatric surgery 08/13/2015   Home Medication(s) Prior to Admission medications   Medication Sig Start Date End Date Taking? Authorizing Provider  amoxicillin -clavulanate (AUGMENTIN ) 875-125 MG tablet Take 1 tablet by mouth every 12 (twelve) hours. 08/24/23  Yes Francesca Elsie CROME, MD  oxyCODONE  (ROXICODONE ) 5 MG immediate release tablet Take 1 tablet (5 mg total) by mouth every 6 (six) hours as needed for severe pain (pain score 7-10). 08/24/23  Yes Francesca Elsie CROME, MD  cyclobenzaprine  (FLEXERIL ) 10 MG tablet Take 1 tablet (10 mg total) by mouth 2 (two) times daily as needed for muscle spasms. 12/24/19   Nivia Colon, PA-C  ibuprofen  (ADVIL ) 600 MG tablet Take 1 tablet (600 mg total) by mouth every 6 (six) hours as  needed. 12/24/19   Nivia Colon, PA-C  meclizine  (ANTIVERT ) 25 MG tablet Take 1 tablet (25 mg total) by mouth 3 (three) times daily as needed for dizziness. 11/02/21   Kommor, Lum, MD                                                                                                                                    Past Surgical History Past Surgical History:  Procedure Laterality Date   CESAREAN SECTION     LAPAROSCOPIC GASTRIC BANDING  2008   Family History Family History  Problem Relation Age of Onset   Cancer Maternal Grandmother        stomach ca    Social History Social History   Tobacco Use   Smoking status: Former   Smokeless tobacco: Never   Tobacco comments:    Vapour  Substance Use Topics   Alcohol use: Not Currently   Drug use: No   Allergies  Patient has no known allergies.  Review of Systems Review of Systems  All other systems reviewed and are negative.   Physical Exam Vital Signs  I have reviewed the triage vital signs BP (!) 172/84 (BP Location: Left Arm)   Pulse 79   Temp 99 F (37.2 C) (Oral)   Resp 14   Ht 5' 5 (1.651 m)   Wt 117.9 kg   LMP 06/24/2016   SpO2 100%   BMI 43.27 kg/m  Physical Exam Vitals and nursing note reviewed.  Constitutional:      General: She is not in acute distress.    Appearance: She is well-developed.  HENT:     Head: Normocephalic and atraumatic.     Mouth/Throat:     Mouth: Mucous membranes are moist.  Eyes:     Pupils: Pupils are equal, round, and reactive to light.  Cardiovascular:     Rate and Rhythm: Normal rate and regular rhythm.     Heart sounds: No murmur heard. Pulmonary:     Effort: Pulmonary effort is normal. No respiratory distress.     Breath sounds: Normal breath sounds.  Abdominal:     General: Abdomen is flat.     Palpations: Abdomen is soft.     Tenderness: There is generalized abdominal tenderness. There is no guarding or rebound.  Musculoskeletal:        General: No tenderness.      Right lower leg: No edema.     Left lower leg: No edema.  Skin:    General: Skin is warm and dry.  Neurological:     General: No focal deficit present.     Mental Status: She is alert. Mental status is at baseline.  Psychiatric:        Mood and Affect: Mood normal.        Behavior: Behavior normal.     ED Results and Treatments Labs (all labs ordered are listed, but only abnormal results are displayed) Labs Reviewed  COMPREHENSIVE METABOLIC PANEL WITH GFR - Abnormal; Notable for the following components:      Result Value   Potassium 3.4 (*)    Calcium 8.7 (*)    All other components within normal limits  LIPASE, BLOOD  CBC                                                                                                                          Radiology CT ABDOMEN PELVIS W CONTRAST Result Date: 08/24/2023 CLINICAL DATA:  Abdominal pain. EXAM: CT ABDOMEN AND PELVIS WITH CONTRAST TECHNIQUE: Multidetector CT imaging of the abdomen and pelvis was performed using the standard protocol following bolus administration of intravenous contrast. RADIATION DOSE REDUCTION: This exam was performed according to the departmental dose-optimization program which includes automated exposure control, adjustment of the mA and/or kV according to patient size and/or use of iterative reconstruction technique. CONTRAST:  OMNIPAQUE  IOHEXOL  300 MG/ML  SOLN COMPARISON:  None Available. FINDINGS: Lower chest:  No acute abnormality. Hepatobiliary: There is diffuse fatty infiltration of the liver parenchyma. No focal liver abnormality is seen. No gallstones, gallbladder wall thickening, or biliary dilatation. Pancreas: Unremarkable. No pancreatic ductal dilatation or surrounding inflammatory changes. Spleen: A subcentimeter cyst versus hemangioma is seen within the anterior aspect of an otherwise normal-appearing spleen. Adrenals/Urinary Tract: Adrenal glands are unremarkable. Kidneys are normal, without renal  calculi, focal lesion, or hydronephrosis. Bladder is unremarkable. Stomach/Bowel: There is evidence of gastric banding surgery. Stomach is within normal limits. Appendix appears normal. No evidence of bowel dilatation. Moderate to marked severity diffuse colonic wall thickening and inflammation is noted. Numerous diverticula are seen throughout the sigmoid colon. Vascular/Lymphatic: No significant vascular findings are present. No enlarged abdominal or pelvic lymph nodes. Reproductive: Uterus and bilateral adnexa are unremarkable. Other: A 2.5 cm x 1.6 cm x 1.9 cm fat containing umbilical hernia is noted. No abdominopelvic ascites. Musculoskeletal: No acute or significant osseous findings. IMPRESSION: 1. Moderate to marked severity diffuse colitis. 2. Sigmoid diverticulosis. 3. Hepatic steatosis. 4. Evidence of prior gastric banding surgery. 5. Small fat-containing umbilical hernia. Electronically Signed   By: Suzen Dials M.D.   On: 08/24/2023 17:23    Pertinent labs & imaging results that were available during my care of the patient were reviewed by me and considered in my medical decision making (see MDM for details).  Medications Ordered in ED Medications  potassium chloride  (KLOR-CON ) packet 40 mEq (has no administration in time range)  iohexol  (OMNIPAQUE ) 300 MG/ML solution 100 mL (100 mLs Intravenous Contrast Given 08/24/23 1651)  oxyCODONE  (Oxy IR/ROXICODONE ) immediate release tablet 5 mg (5 mg Oral Given 08/24/23 2131)  acetaminophen  (TYLENOL ) tablet 1,000 mg (1,000 mg Oral Given 08/24/23 2131)  amoxicillin -clavulanate (AUGMENTIN ) 875-125 MG per tablet 1 tablet (1 tablet Oral Given 08/24/23 2131)                                                                                                                                     Procedures Procedures  (including critical care time)  Medical Decision Making / ED Course   MDM:  53 year old presenting to the emergency department with  abdominal pain, diarrhea.  Patient is overall well-appearing, vital signs reassuring.  No tachycardia, hypotension.  Abdominal exam with mild generalized tenderness without focal tenderness  Given abdominal pain and diarrhea, CT scan was obtained which shows evidence of colitis. No evidence of acute surgical process. Patient is overall nontoxic-appearing.  Her laboratory testing is otherwise reassuring.  Low concern for process such as ischemic colitis or inflammatory process.  Given recent travel concern for underlying bacterial process will prescribe Augmentin  and give dose in ER.  With reassuring laboratory testing, reassuring abdominal exam, feel patient is stable for discharge with strict return precautions.  Will prescribe small in oxycodone , reviewed PDMP and discussed safe use of medicine with patient. Will discharge patient to home. All questions answered. Patient comfortable with plan  of discharge. Return precautions discussed with patient and specified on the after visit summary.       Additional history obtained: -Additional history obtained from friend -External records from outside source obtained and reviewed including: Chart review including previous notes, labs, imaging, consultation notes including prior notes    Lab Tests: -I ordered, reviewed, and interpreted labs.   The pertinent results include:   Labs Reviewed  COMPREHENSIVE METABOLIC PANEL WITH GFR - Abnormal; Notable for the following components:      Result Value   Potassium 3.4 (*)    Calcium 8.7 (*)    All other components within normal limits  LIPASE, BLOOD  CBC    Notable for hypokalemia, mild  Imaging Studies ordered: I ordered imaging studies including CT  On my interpretation imaging demonstrates colitis  I independently visualized and interpreted imaging. I agree with the radiologist interpretation   Medicines ordered and prescription drug management: Meds ordered this encounter  Medications    iohexol  (OMNIPAQUE ) 300 MG/ML solution 100 mL   oxyCODONE  (Oxy IR/ROXICODONE ) immediate release tablet 5 mg    Refill:  0   acetaminophen  (TYLENOL ) tablet 1,000 mg   amoxicillin -clavulanate (AUGMENTIN ) 875-125 MG per tablet 1 tablet   amoxicillin -clavulanate (AUGMENTIN ) 875-125 MG tablet    Sig: Take 1 tablet by mouth every 12 (twelve) hours.    Dispense:  14 tablet    Refill:  0   oxyCODONE  (ROXICODONE ) 5 MG immediate release tablet    Sig: Take 1 tablet (5 mg total) by mouth every 6 (six) hours as needed for severe pain (pain score 7-10).    Dispense:  10 tablet    Refill:  0   potassium chloride  (KLOR-CON ) packet 40 mEq    -I have reviewed the patients home medicines and have made adjustments as needed   Social Determinants of Health:  Diagnosis or treatment significantly limited by social determinants of health: obesity   Reevaluation: After the interventions noted above, I reevaluated the patient and found that their symptoms have improved  Co morbidities that complicate the patient evaluation  Past Medical History:  Diagnosis Date   Anemia       Dispostion: Disposition decision including need for hospitalization was considered, and patient discharged from emergency department.    Final Clinical Impression(s) / ED Diagnoses Final diagnoses:  Colitis     This chart was dictated using voice recognition software.  Despite best efforts to proofread,  errors can occur which can change the documentation meaning.    Francesca Elsie CROME, MD 08/24/23 [phone]

## 2023-08-24 NOTE — ED Triage Notes (Signed)
 Pt came in for abdominal pain that started today. Pt had [employer] last night and believes that might be the cause. Pt stated she started to have diarrhea last night. Pt describes her diarrhea as flaky. No complaints of nausea or vomiting.

## 2023-09-11 ENCOUNTER — Encounter (HOSPITAL_BASED_OUTPATIENT_CLINIC_OR_DEPARTMENT_OTHER): Payer: Self-pay | Admitting: Urology

## 2023-09-11 ENCOUNTER — Emergency Department (HOSPITAL_BASED_OUTPATIENT_CLINIC_OR_DEPARTMENT_OTHER)
Admission: EM | Admit: 2023-09-11 | Discharge: 2023-09-11 | Disposition: A | Discharge status: Home / Self Care | Attending: Emergency Medicine | Admitting: Emergency Medicine

## 2023-09-11 ENCOUNTER — Other Ambulatory Visit: Payer: Self-pay

## 2023-09-11 DIAGNOSIS — H1033 Unspecified acute conjunctivitis, bilateral: Secondary | ICD-10-CM | POA: Diagnosis not present

## 2023-09-11 DIAGNOSIS — H5789 Other specified disorders of eye and adnexa: Secondary | ICD-10-CM | POA: Diagnosis present

## 2023-09-11 DIAGNOSIS — B9689 Other specified bacterial agents as the cause of diseases classified elsewhere: Secondary | ICD-10-CM | POA: Diagnosis not present

## 2023-09-11 MED ORDER — LEVOFLOXACIN 0.5 % OP SOLN: 1.0000 [drp] | OPHTHALMIC | 0 refills | Status: AC

## 2023-09-11 MED ORDER — FLUORESCEIN SODIUM 1 MG OP STRP
1.0000 | ORAL_STRIP | Freq: Once | OPHTHALMIC | Status: AC
  Administered 2023-09-11: 1 via OPHTHALMIC
  Filled 2023-09-11: qty 1

## 2023-09-11 MED ORDER — TETRACAINE HCL 0.5 % OP SOLN
1.0000 [drp] | Freq: Once | OPHTHALMIC | Status: AC
  Administered 2023-09-11: 1 [drp] via OPHTHALMIC
  Filled 2023-09-11: qty 4

## 2023-09-11 NOTE — ED Notes (Signed)
 Pt alert and oriented X 4 at the time of discharge. RR even and unlabored. No acute distress noted. Pt verbalized understanding of discharge instructions as discussed. Pt ambulatory to lobby at time of discharge.

## 2023-09-11 NOTE — Discharge Instructions (Addendum)
 As discussed, you will need to follow-up with your ophthalmologist.  Seek emergency care if experiencing any new or worsening symptoms.
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
## 2023-09-11 NOTE — ED Triage Notes (Signed)
 Pt states both eyes red, watery, crusty, and painful since yesterday  Works with [employer] and was working with [employer] like substance
